# Patient Record
Sex: Female | Born: 1980 | Race: Asian | Hispanic: No | Marital: Married | Smoking: Former smoker
Health system: Southern US, Community
[De-identification: ages and names within clinical notes are randomized; demographics above are authoritative.]

## PROBLEM LIST (undated history)

## (undated) ENCOUNTER — Inpatient Hospital Stay (HOSPITAL_COMMUNITY): Payer: Self-pay

## (undated) DIAGNOSIS — Z789 Other specified health status: Secondary | ICD-10-CM

## (undated) DIAGNOSIS — Z8759 Personal history of other complications of pregnancy, childbirth and the puerperium: Secondary | ICD-10-CM

## (undated) DIAGNOSIS — O009 Unspecified ectopic pregnancy without intrauterine pregnancy: Secondary | ICD-10-CM

## (undated) DIAGNOSIS — IMO0002 Reserved for concepts with insufficient information to code with codable children: Secondary | ICD-10-CM

## (undated) HISTORY — DX: Personal history of other complications of pregnancy, childbirth and the puerperium: Z87.59

## (undated) HISTORY — DX: Unspecified ectopic pregnancy without intrauterine pregnancy: O00.90

---

## 2002-01-29 ENCOUNTER — Other Ambulatory Visit: Admission: RE | Admit: 2002-01-29 | Discharge: 2002-01-29 | Payer: Self-pay | Admitting: Family Medicine

## 2002-10-07 ENCOUNTER — Other Ambulatory Visit: Admission: RE | Admit: 2002-10-07 | Discharge: 2002-10-07 | Payer: Self-pay | Admitting: Obstetrics and Gynecology

## 2003-04-12 ENCOUNTER — Inpatient Hospital Stay (HOSPITAL_COMMUNITY): Admission: AD | Admit: 2003-04-12 | Discharge: 2003-04-14 | Payer: Self-pay | Admitting: Obstetrics and Gynecology

## 2003-04-13 ENCOUNTER — Encounter: Payer: Self-pay | Admitting: Obstetrics and Gynecology

## 2003-05-03 ENCOUNTER — Inpatient Hospital Stay (HOSPITAL_COMMUNITY): Admission: AD | Admit: 2003-05-03 | Discharge: 2003-05-05 | Payer: Self-pay | Admitting: *Deleted

## 2003-05-06 ENCOUNTER — Encounter: Admission: RE | Admit: 2003-05-06 | Discharge: 2003-06-05 | Payer: Self-pay | Admitting: *Deleted

## 2003-06-13 ENCOUNTER — Other Ambulatory Visit: Admission: RE | Admit: 2003-06-13 | Discharge: 2003-06-13 | Payer: Self-pay | Admitting: Obstetrics and Gynecology

## 2004-07-13 ENCOUNTER — Other Ambulatory Visit: Admission: RE | Admit: 2004-07-13 | Discharge: 2004-07-13 | Payer: Self-pay | Admitting: Obstetrics and Gynecology

## 2004-10-30 ENCOUNTER — Ambulatory Visit: Payer: Self-pay | Admitting: Family Medicine

## 2004-12-25 ENCOUNTER — Ambulatory Visit: Payer: Self-pay | Admitting: Family Medicine

## 2004-12-26 ENCOUNTER — Ambulatory Visit: Payer: Self-pay | Admitting: Internal Medicine

## 2005-01-15 ENCOUNTER — Ambulatory Visit: Payer: Self-pay | Admitting: Family Medicine

## 2005-04-02 ENCOUNTER — Ambulatory Visit: Payer: Self-pay | Admitting: Family Medicine

## 2005-07-09 ENCOUNTER — Ambulatory Visit: Payer: Self-pay | Admitting: Family Medicine

## 2005-08-02 ENCOUNTER — Ambulatory Visit: Payer: Self-pay | Admitting: Family Medicine

## 2005-08-02 ENCOUNTER — Other Ambulatory Visit: Admission: RE | Admit: 2005-08-02 | Discharge: 2005-08-02 | Payer: Self-pay | Admitting: Family Medicine

## 2005-09-25 ENCOUNTER — Encounter: Payer: Self-pay | Admitting: Family Medicine

## 2005-10-16 ENCOUNTER — Ambulatory Visit: Payer: Self-pay | Admitting: Family Medicine

## 2005-10-28 ENCOUNTER — Ambulatory Visit: Payer: Self-pay | Admitting: Family Medicine

## 2006-01-10 ENCOUNTER — Ambulatory Visit: Payer: Self-pay | Admitting: Family Medicine

## 2006-02-25 ENCOUNTER — Ambulatory Visit: Payer: Self-pay | Admitting: Family Medicine

## 2006-07-29 ENCOUNTER — Ambulatory Visit: Payer: Self-pay | Admitting: Family Medicine

## 2006-08-14 ENCOUNTER — Ambulatory Visit: Payer: Self-pay | Admitting: Family Medicine

## 2006-08-19 ENCOUNTER — Ambulatory Visit: Payer: Self-pay | Admitting: Family Medicine

## 2006-08-21 DIAGNOSIS — A5609 Other chlamydial infection of lower genitourinary tract: Secondary | ICD-10-CM | POA: Insufficient documentation

## 2006-08-22 ENCOUNTER — Ambulatory Visit: Payer: Self-pay | Admitting: Family Medicine

## 2006-11-14 ENCOUNTER — Ambulatory Visit: Payer: Self-pay | Admitting: Family Medicine

## 2006-11-14 ENCOUNTER — Encounter: Payer: Self-pay | Admitting: Family Medicine

## 2006-11-14 ENCOUNTER — Encounter (INDEPENDENT_AMBULATORY_CARE_PROVIDER_SITE_OTHER): Payer: Self-pay | Admitting: Specialist

## 2006-11-14 ENCOUNTER — Other Ambulatory Visit: Admission: RE | Admit: 2006-11-14 | Discharge: 2006-11-14 | Payer: Self-pay | Admitting: Family Medicine

## 2007-02-05 ENCOUNTER — Encounter: Payer: Self-pay | Admitting: Family Medicine

## 2007-02-05 DIAGNOSIS — N6019 Diffuse cystic mastopathy of unspecified breast: Secondary | ICD-10-CM | POA: Insufficient documentation

## 2007-02-21 ENCOUNTER — Emergency Department: Payer: Self-pay | Admitting: Emergency Medicine

## 2007-02-22 ENCOUNTER — Inpatient Hospital Stay (HOSPITAL_COMMUNITY): Admission: AD | Admit: 2007-02-22 | Discharge: 2007-02-22 | Payer: Self-pay | Admitting: Obstetrics and Gynecology

## 2007-10-22 DIAGNOSIS — IMO0002 Reserved for concepts with insufficient information to code with codable children: Secondary | ICD-10-CM

## 2007-10-22 DIAGNOSIS — R87619 Unspecified abnormal cytological findings in specimens from cervix uteri: Secondary | ICD-10-CM

## 2007-10-22 HISTORY — DX: Reserved for concepts with insufficient information to code with codable children: IMO0002

## 2007-10-22 HISTORY — DX: Unspecified abnormal cytological findings in specimens from cervix uteri: R87.619

## 2007-10-22 HISTORY — PX: CRYOTHERAPY: SHX1416

## 2008-04-01 ENCOUNTER — Ambulatory Visit: Payer: Self-pay | Admitting: Family Medicine

## 2008-04-01 DIAGNOSIS — N390 Urinary tract infection, site not specified: Secondary | ICD-10-CM | POA: Insufficient documentation

## 2008-04-01 LAB — CONVERTED CEMR LAB
Ketones, urine, test strip: NEGATIVE
Nitrite: NEGATIVE
Urine crystals, microscopic: 0 /hpf

## 2008-04-02 ENCOUNTER — Encounter: Payer: Self-pay | Admitting: Family Medicine

## 2008-05-03 ENCOUNTER — Ambulatory Visit: Payer: Self-pay | Admitting: Family Medicine

## 2008-05-03 DIAGNOSIS — R87619 Unspecified abnormal cytological findings in specimens from cervix uteri: Secondary | ICD-10-CM | POA: Insufficient documentation

## 2009-04-06 ENCOUNTER — Ambulatory Visit: Payer: Self-pay | Admitting: Family Medicine

## 2009-04-06 LAB — CONVERTED CEMR LAB
Blood in Urine, dipstick: NEGATIVE
Protein, U semiquant: NEGATIVE
Urobilinogen, UA: 0.2
WBC Urine, dipstick: NEGATIVE

## 2009-05-02 ENCOUNTER — Telehealth: Payer: Self-pay | Admitting: Family Medicine

## 2009-05-04 ENCOUNTER — Ambulatory Visit: Payer: Self-pay | Admitting: Family Medicine

## 2009-06-27 ENCOUNTER — Ambulatory Visit: Payer: Self-pay | Admitting: Family Medicine

## 2009-06-27 LAB — CONVERTED CEMR LAB
Bacteria, UA: 0
Glucose, Urine, Semiquant: NEGATIVE
Protein, U semiquant: NEGATIVE
WBC Urine, dipstick: NEGATIVE
pH: 6.5

## 2009-12-29 ENCOUNTER — Ambulatory Visit: Payer: Self-pay | Admitting: Family Medicine

## 2009-12-29 LAB — CONVERTED CEMR LAB
Casts: 0 /lpf
Ketones, urine, test strip: NEGATIVE
Specific Gravity, Urine: 1.01
Urine crystals, microscopic: 0 /hpf
Yeast, UA: 0

## 2010-06-14 ENCOUNTER — Ambulatory Visit: Payer: Self-pay

## 2010-11-20 NOTE — Assessment & Plan Note (Signed)
Summary: uti/ alc   Vital Signs:  Patient profile:   30 year old female Height:      59 inches Weight:      126.50 pounds BMI:     25.64 Temp:     98.4 degrees F oral Pulse rate:   80 / minute Pulse rhythm:   regular BP sitting:   100 / 68  (right arm) Cuff size:   regular  Vitals Entered By: Lewanda Rife LPN (December 29, 2009 12:30 PM)  History of Present Illness: yesteray in class urgency to urinate and then painful  burning to urinate and having to go all the time pyridium really helps the pain  no fever or vomiting  is drinking water an drinking lots of water   last uti was in sept   a lot of stress- uncle just died unexpectedly - and she spent a lot of time at 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 Sulfa 2)  ! Asa 3)  ! 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 Eyes:  Denies blurring. CV:  Denies chest pain or discomfort and palpitations. Resp:  Denies cough and shortness of breath. GI:  Denies change in bowel habits, nausea, and vomiting. GU:  Complains of dysuria; denies urinary frequency. MS:  Complains of low back pain. Derm:  Denies poor wound healing and rash.  Physical Exam  General:  Well-developed,well-nourished,in no acute distress; alert,appropriate and cooperative throughout examination Head:  normocephalic, atraumatic, and no abnormalities observed.   Mouth:  pharynx  pink and moist.   Neck:  supple with full rom and no masses or thyromegally, no JVD or carotid bruit  Lungs:  Normal respiratory effort, chest expands symmetrically. Lungs are clear to auscultation, no crackles or wheezes. Heart:  Normal rate and regular rhythm. S1 and S2 normal without gallop, murmur, click, rub or other extra sounds. Abdomen:  no suprapubic tenderness or fullness felt  Msk:  no CVA tenderness  Skin:  Intact without suspicious lesions or rashes Inguinal Nodes:  No significant adenopathy Psych:  normal affect, talkative and pleasant    Impression & Recommendations:  Problem # 1:  UTI (ICD-599.0) new uti (last in fall ) -- in light of this sent for cx tx with low dose cipro and update pt advised to update me if symptoms worsen or do not improve  adv inc water intake rev ways to avoid utis-- drink water and do not hold urine too long  The following medications were removed from the medication list:    Ciprofloxacin Hcl 500 Mg Tabs (Ciprofloxacin hcl) .Marland Kitchen... Take 1 two times a day Her updated medication list for this problem includes:    Azo-standard 95 Mg Tabs (Phenazopyridine hcl) ..... Otc as directed.    Cipro 250 Mg Tabs (Ciprofloxacin hcl) .Marland Kitchen... 1 by mouth two times a day for 5 days  Orders: T-Culture, Urine ([id number]) UA Dipstick W/ Micro (manual) (09811) Prescription  Created Electronically [phone])  Complete Medication List: 1)  Depo-provera 150 Mg/ml Im Susp (Medroxyprogesterone acetate) .... Injections every 3 months as directed 2)  Azo-standard 95 Mg Tabs (Phenazopyridine hcl) .... Otc as directed. 3)  Cipro 250 Mg Tabs (Ciprofloxacin hcl) .Marland Kitchen.. 1 by mouth two times a day for 5 days  Patient Instructions: 1)  continue drinking lots of water 2)  call or seek care is symptoms don't improve in 2-3 days or if you develop back pain, nausea, or vomiting  3)  I will update you when culture returns  Prescriptions: CIPRO 250 MG TABS (CIPROFLOXACIN HCL) 1 by  mouth two times a day for 5 days  #10 x 0   Entered and Authorized by:   Judith Part MD   Signed by:   Judith Part MD on 12/29/2009   Method used:   Electronically to        [facility]. [address] [phone]* (retail)       [address] [place], [place]  [id number]       Ph: [phone]       Fax: [phone]   RxID:   [id number]   Current Allergies (reviewed today): ! SULFA ! ASA ! Fredricka Bonine  Laboratory Results   Urine Tests  Date/Time Received: December 29, 2009 12:31 PM  Date/Time Reported: December 29, 2009 12:31 PM   Routine Urinalysis   Color: orange Appearance: Clear Glucose: negative   (Normal Range: Negative) Bilirubin: negative   (Normal Range: Negative) Ketone: negative   (Normal Range: Negative) Spec. Gravity: 1.010   (Normal Range: 1.003-1.035) Blood: trace-lysed   (Normal Range: Negative) pH: 6.5   (Normal Range: 5.0-8.0) Protein: orange   (Normal Range: Negative) Urobilinogen: orange   (Normal Range: 0-1) Nitrite: orange   (Normal Range: Negative) Leukocyte Esterace: orange   (Normal Range: Negative)  Urine Microscopic WBC/HPF: 4-5 RBC/HPF: 2-3 Bacteria/HPF: mild Mucous/HPF: few Epithelial/HPF: 1-2 Crystals/HPF: 0 Casts/LPF: 0 Yeast/HPF: 0 Other: 0    Comments: Pt took Azo standard which makes urine orange.     Laboratory Results   Urine Tests    Routine Urinalysis   Color: orange Appearance: Clear Glucose: negative   (Normal Range: Negative) Bilirubin: negative   (Normal Range: Negative) Ketone: negative   (Normal Range: Negative) Spec. Gravity: 1.010   (Normal Range: 1.003-1.035) Blood: trace-lysed   (Normal Range: Negative) pH: 6.5   (Normal Range: 5.0-8.0) Protein: orange   (Normal Range: Negative) Urobilinogen: orange   (Normal Range: 0-1) Nitrite: orange   (Normal Range: Negative) Leukocyte Esterace: orange   (Normal Range: Negative)  Urine Microscopic WBC/hpf: 4-5 RBC/hpf: 2-3 Bacteria: mild Mucous:  few Epithelial: 1-2 Crystals/LPF: 0 Casts/LPF: 0 Yeast/HPF: 0 Other: 0    Comments: Pt took Azo standard which makes urine orange.

## 2011-03-08 NOTE — Discharge Summary (Signed)
NAMEFELINA, TELLO NO.:  [id number]   MEDICAL RECORD NO.:  [id number]                   PATIENT TYPE:   LOCATION:                                       FACILITY:   PHYSICIAN:  Tracie Harrier, M.D.              DATE OF BIRTH:  [birth year]   DATE OF ADMISSION:  04/12/2003  DATE OF DISCHARGE:  04/14/2003                                 DISCHARGE SUMMARY   ADMISSION DIAGNOSES:  1. Intrauterine pregnancy at 33-1/2 weeks estimated gestational age.  2. Preterm labor.   DISCHARGE DIAGNOSES:  1. Intrauterine pregnancy at 2 weeks estimated gestational age.  2. Preterm labor on oral tocolytics.   REASON FOR ADMISSION:  Please see written H&P.   HOSPITAL COURSE:  #1.  The patient was a 30 year old primigravida who was  admitted to [facility] at 33-1/2 weeks estimated  gestational age.  Prenatal care had been uncomplicated.  The patient  presented to [facility] with complaints of low abdominal pain.  She  denied leakage of amniotic fluid or bleeding.  Cervix was 2 cm dilated, 50%  effaced with a vertex at a 0 station.  Contractions were noted on monitor.  The patient was then admitted for tocolysis.  Fetal heart tones were  reactive in the 140s-160s.  Ultrasound revealed placenta in the anterior  position without evidence of separation.  Amniotic fluid volume was within  normal limits.  The patient was given an injection of subcutaneous  terbutaline and later started on magnesium sulfate.  IV antibiotics were  administered prophylactically and betamethasone was given to enhance fetal  lung maturity.  A CBC was drawn revealing a hemoglobin of 11.2 and a  platelet count of 321,000.   #2.  On the following morning contractions continued at approximately 12 per  hour.  Magnesium sulfate was then increased and a second dose of  betamethasone was administered.  Fetal heart tones remained reactive.  On  hospital day three:   Contractions had resolved.  Fetal heart tones were  reactive.  Vital signs were stable.  Magnesium sulfate was discontinued.  The patient was placed on oral tocolytics.  The patient was later discharged  home that same day.   CONDITION ON DISCHARGE:  Stable.   DIET:  Regular as tolerated.   ACTIVITY:  Pelvic rest and no heavy lifting.  The patient was encouraged to  remain on bed rest until she returned to the office.   FOLLOWUP:  The patient is to return to the office in approximately two days.  She is to call for increasing pelvic pressure, leakage of amniotic fluid or  bleeding or increase in menstrual-like cramping.    DISCHARGE MEDICATIONS:  1. Terbutaline 2.5 mg one p.o. four hours.  2. Prenatal vitamins one p.o. daily.  3. Colace one p.o. daily p.r.n.     Julio Sicks, N.P.  Tracie Harrier, M.D.    CC/MEDQ  D:  05/27/2003  T:  05/27/2003  Job:  [id number]

## 2011-04-06 ENCOUNTER — Emergency Department: Payer: Self-pay | Admitting: Emergency Medicine

## 2011-11-28 ENCOUNTER — Inpatient Hospital Stay (HOSPITAL_COMMUNITY)
Admission: AD | Admit: 2011-11-28 | Discharge: 2011-11-28 | Disposition: A | Discharge status: Home / Self Care | Payer: BC Managed Care – PPO | Source: Ambulatory Visit | Attending: Obstetrics and Gynecology | Admitting: Obstetrics and Gynecology

## 2011-11-28 ENCOUNTER — Inpatient Hospital Stay (HOSPITAL_COMMUNITY): Payer: BC Managed Care – PPO

## 2011-11-28 ENCOUNTER — Encounter (HOSPITAL_COMMUNITY): Payer: Self-pay | Admitting: *Deleted

## 2011-11-28 DIAGNOSIS — O200 Threatened abortion: Secondary | ICD-10-CM

## 2011-11-28 DIAGNOSIS — O209 Hemorrhage in early pregnancy, unspecified: Secondary | ICD-10-CM | POA: Insufficient documentation

## 2011-11-28 HISTORY — DX: Other specified health status: Z78.9

## 2011-11-28 LAB — CBC
MCH: 24.9 pg — ABNORMAL LOW (ref 26.0–34.0)
MCHC: 33.8 g/dL (ref 30.0–36.0)
Platelets: 287 10*3/uL (ref 150–400)
RDW: 13.9 % (ref 11.5–15.5)

## 2011-11-28 LAB — POCT PREGNANCY, URINE: Preg Test, Ur: POSITIVE — AB

## 2011-11-28 LAB — URINALYSIS, ROUTINE W REFLEX MICROSCOPIC
Ketones, ur: NEGATIVE mg/dL
Leukocytes, UA: NEGATIVE
Nitrite: NEGATIVE
Protein, ur: NEGATIVE mg/dL
pH: 6.5 (ref 5.0–8.0)

## 2011-11-28 LAB — WET PREP, GENITAL
Clue Cells Wet Prep HPF POC: NONE SEEN
Yeast Wet Prep HPF POC: NONE SEEN

## 2011-11-28 NOTE — Progress Notes (Addendum)
Unsure of LMP, has been on Depo-Provera until 8/12, intentionally stopped, no period since.  Spotting brown  x1 wk, now bright red bleeding.  Had blood work done in office on Tues this week.

## 2011-11-28 NOTE — ED Provider Notes (Signed)
History     CSN: [id number]  Arrival date & time 11/28/11  1408   None     No chief complaint on file.   HPI Natalie Kelley is a 31 y.o. female @ 9 weeks and 4 days gestation who presents to MAU for vaginal bleeding. Ultrasound done in office 1/21 and showed an early IUP with cardiac activity. (Per Dr. Vincente Poli). Patient states she has had spotting for the past week and then today bleeding increased and was bright read. The history was provided by the patient and Dr. Vincente Poli.   Past Medical History  Diagnosis Date  . No pertinent past medical history     Past Surgical History  Procedure Date  . No past surgeries     History reviewed. No pertinent family history.  History  Substance Use Topics  . Smoking status: Never Smoker   . Smokeless tobacco: Not on file  . Alcohol Use: No    OB History    Grav Para Term Preterm Abortions TAB SAB Ect Mult Living   3 1  1 1  1   1       Review of Systems  Constitutional: Negative for fever, chills, diaphoresis and fatigue.  HENT: Negative for ear pain, congestion, sore throat, facial swelling, neck pain, neck stiffness, dental problem and sinus pressure.   Eyes: Negative for photophobia, pain and discharge.  Respiratory: Negative for cough, chest tightness and wheezing.   Gastrointestinal: Negative for nausea, vomiting, abdominal pain, diarrhea, constipation and abdominal distention.  Genitourinary: Positive for frequency and vaginal bleeding. Negative for dysuria, urgency, flank pain, difficulty urinating and vaginal pain.  Musculoskeletal: Negative for myalgias, back pain and gait problem.  Skin: Negative for color change and rash.  Neurological: Negative for dizziness, speech difficulty, weakness, light-headedness, numbness and headaches.  Psychiatric/Behavioral: Negative for confusion and agitation.    Allergies  Aspirin; Sulfonamide derivatives; and Terbutaline  Home Medications  No current outpatient prescriptions on  file.  BP 107/57  Pulse 85  Temp(Src) 98.8 F (37.1 C) (Oral)  Resp 18  Ht 4\' 11"  (1.499 m)  Wt 132 lb (59.875 kg)  BMI 26.66 kg/m2  Breastfeeding? Unknown  Physical Exam  Nursing note and vitals reviewed. Constitutional: She is oriented to person, place, and time. She appears well-developed and well-nourished.  HENT:  Head: Normocephalic.  Eyes: EOM are normal.  Neck: Neck supple.  Cardiovascular: Normal rate.   Pulmonary/Chest: Effort normal.  Abdominal: Soft. There is no tenderness.  Genitourinary:       External genitalia without lesions. Small amount of blood vaginal vault. Cervix closed, long, no CMT, no adnexal tenderness. Uterus approximately 8 to 10 week size.   Musculoskeletal: Normal range of motion.  Neurological: She is alert and oriented to person, place, and time. No cranial nerve deficit.  Skin: Skin is warm and dry.  Psychiatric: She has a normal mood and affect. Her behavior is normal. Judgment and thought content normal.   Results for orders placed during the hospital encounter of 11/28/11 (from the past 24 hour(s))  URINALYSIS, ROUTINE W REFLEX MICROSCOPIC     Status: Abnormal   Collection Time   11/28/11  2:30 PM      Component Value Range   Color, Urine YELLOW  YELLOW    APPearance HAZY (*) CLEAR    Specific Gravity, Urine 1.015  1.005 - 1.030    pH 6.5  5.0 - 8.0    Glucose, UA NEGATIVE  NEGATIVE (mg/dL)   Hgb urine  dipstick LARGE (*) NEGATIVE    Bilirubin Urine NEGATIVE  NEGATIVE    Ketones, ur NEGATIVE  NEGATIVE (mg/dL)   Protein, ur NEGATIVE  NEGATIVE (mg/dL)   Urobilinogen, UA 0.2  0.0 - 1.0 (mg/dL)   Nitrite NEGATIVE  NEGATIVE    Leukocytes, UA NEGATIVE  NEGATIVE   URINE MICROSCOPIC-ADD ON     Status: Abnormal   Collection Time   11/28/11  2:30 PM      Component Value Range   Squamous Epithelial / LPF FEW (*) RARE    WBC, UA 0-2  <3 (WBC/hpf)   RBC / HPF TOO NUMEROUS TO COUNT  <3 (RBC/hpf)   Bacteria, UA RARE  RARE   POCT PREGNANCY, URINE      Status: Abnormal   Collection Time   11/28/11  2:41 PM      Component Value Range   Preg Test, Ur POSITIVE (*) NEGATIVE   ABO/RH     Status: Normal   Collection Time   11/28/11  3:15 PM      Component Value Range   ABO/RH(D) O POS    CBC     Status: Abnormal   Collection Time   11/28/11  3:15 PM      Component Value Range   WBC 9.1  4.0 - 10.5 (K/uL)   RBC 4.93  3.87 - 5.11 (MIL/uL)   Hemoglobin 12.3  12.0 - 15.0 (g/dL)   HCT 46.9  62.9 - 52.8 (%)   MCV 73.8 (*) 78.0 - 100.0 (fL)   MCH 24.9 (*) 26.0 - 34.0 (pg)   MCHC 33.8  30.0 - 36.0 (g/dL)   RDW 41.3  24.4 - 01.0 (%)   Platelets 287  150 - 400 (K/uL)  WET PREP, GENITAL     Status: Abnormal   Collection Time   11/28/11  4:10 PM      Component Value Range   Yeast Wet Prep HPF POC NONE SEEN  NONE SEEN    Trich, Wet Prep NONE SEEN  NONE SEEN    Clue Cells Wet Prep HPF POC NONE SEEN  NONE SEEN    WBC, Wet Prep HPF POC FEW (*) NONE SEEN     ED Course: Discussed with Dr. Vincente Poli. Will order labs and ultrasound.   Procedures   MDM: verbal report of ultrasound viable IUP with small SCH.    Assessment:  Bleeding in first trimester pregnancy   [facility]  Plan:  Instructions on threatened AB   Follow up in the office.   Return here as needed.       [place], [place] 11/28/11 [phone]

## 2011-11-29 LAB — GC/CHLAMYDIA PROBE AMP, GENITAL: Chlamydia, DNA Probe: NEGATIVE

## 2012-05-06 ENCOUNTER — Inpatient Hospital Stay (HOSPITAL_COMMUNITY)
Admission: AD | Admit: 2012-05-06 | Discharge: 2012-05-10 | DRG: 379 | Disposition: A | Discharge status: Home / Self Care | Payer: BC Managed Care – PPO | Source: Ambulatory Visit | Attending: Obstetrics and Gynecology | Admitting: Obstetrics and Gynecology

## 2012-05-06 ENCOUNTER — Encounter (HOSPITAL_COMMUNITY): Payer: Self-pay | Admitting: *Deleted

## 2012-05-06 ENCOUNTER — Observation Stay (HOSPITAL_COMMUNITY): Payer: BC Managed Care – PPO

## 2012-05-06 DIAGNOSIS — O368390 Maternal care for abnormalities of the fetal heart rate or rhythm, unspecified trimester, not applicable or unspecified: Secondary | ICD-10-CM | POA: Diagnosis not present

## 2012-05-06 DIAGNOSIS — O4700 False labor before 37 completed weeks of gestation, unspecified trimester: Principal | ICD-10-CM | POA: Diagnosis present

## 2012-05-06 HISTORY — DX: Reserved for concepts with insufficient information to code with codable children: IMO0002

## 2012-05-06 LAB — CBC
HCT: 30.3 % — ABNORMAL LOW (ref 36.0–46.0)
Hemoglobin: 10.2 g/dL — ABNORMAL LOW (ref 12.0–15.0)
MCH: 25.1 pg — ABNORMAL LOW (ref 26.0–34.0)
MCHC: 33.7 g/dL (ref 30.0–36.0)

## 2012-05-06 LAB — COMPREHENSIVE METABOLIC PANEL
Alkaline Phosphatase: 145 U/L — ABNORMAL HIGH (ref 39–117)
BUN: 6 mg/dL (ref 6–23)
CO2: 24 mEq/L (ref 19–32)
Chloride: 104 mEq/L (ref 96–112)
GFR calc Af Amer: >90 mL/min (ref >90)
GFR calc non Af Amer: >90 mL/min (ref >90)
Glucose, Bld: 124 mg/dL — ABNORMAL HIGH (ref 70–99)
Potassium: 3.4 mEq/L — ABNORMAL LOW (ref 3.5–5.1)
Total Bilirubin: 0.3 mg/dL (ref 0.3–1.2)

## 2012-05-06 MED ORDER — MAGNESIUM SULFATE BOLUS VIA INFUSION
4.0000 g | Freq: Once | INTRAVENOUS | Status: DC
  Filled 2012-05-06: qty 500

## 2012-05-06 MED ORDER — BETAMETHASONE SOD PHOS & ACET 6 (3-3) MG/ML IJ SUSP
12.0000 mg | INTRAMUSCULAR | Status: AC
  Administered 2012-05-06 – 2012-05-07 (×2): 12 mg via INTRAMUSCULAR
  Filled 2012-05-06 (×2): qty 2

## 2012-05-06 MED ORDER — ACETAMINOPHEN 325 MG PO TABS: 650.0000 mg | ORAL_TABLET | ORAL | Status: DC | PRN

## 2012-05-06 MED ORDER — ZOLPIDEM TARTRATE 5 MG PO TABS: 5.0000 mg | ORAL_TABLET | Freq: Every evening | ORAL | Status: DC | PRN

## 2012-05-06 MED ORDER — CALCIUM CARBONATE ANTACID 500 MG PO CHEW: 2.0000 | CHEWABLE_TABLET | ORAL | Status: DC | PRN

## 2012-05-06 MED ORDER — MAGNESIUM SULFATE 40 G IN LACTATED RINGERS - SIMPLE
2.5000 g/h | INTRAVENOUS | Status: AC
  Administered 2012-05-06: 2 g/h via INTRAVENOUS
  Administered 2012-05-06: 4 g/h via INTRAVENOUS
  Administered 2012-05-07: 2 g/h via INTRAVENOUS
  Administered 2012-05-08: 2.5 g/h via INTRAVENOUS
  Filled 2012-05-06 (×3): qty 500

## 2012-05-06 MED ORDER — PRENATAL MULTIVITAMIN CH
1.0000 | ORAL_TABLET | Freq: Every day | ORAL | Status: DC
  Administered 2012-05-06 – 2012-05-10 (×5): 1 via ORAL
  Filled 2012-05-06 (×5): qty 1

## 2012-05-06 MED ORDER — DOCUSATE SODIUM 100 MG PO CAPS
100.0000 mg | ORAL_CAPSULE | Freq: Every day | ORAL | Status: DC
  Filled 2012-05-06 (×3): qty 1

## 2012-05-06 MED ORDER — LACTATED RINGERS IV SOLN
INTRAVENOUS | Status: DC
  Administered 2012-05-06: 100 mL/h via INTRAVENOUS
  Administered 2012-05-06 – 2012-05-10 (×9): via INTRAVENOUS

## 2012-05-06 NOTE — H&P (Signed)
Natalie Kelley is a 31 y.o. female presenting for preterm labor.  Patient examined at [employer] where she works at 2 am today.  Was told cx=1cm and FFN negative. Was told to FU in office today.  Continues to have UCs last pm and today-moderate.  No ROM/bleeding/epigastric pain or CNS change.  Hx of 36 6/7week delivery. In office by my exam cx=2/80/-2/vtx and moderate UC palpated. Maternal Medical History:  Reason for admission: Reason for admission: contractions.  Contractions: Onset was 6-12 hours ago.    Fetal activity: Perceived fetal activity is normal.      OB History    Grav Para Term Preterm Abortions TAB SAB Ect Mult Living   3 1  1 1  1   1      Past Medical History  Diagnosis Date  . No pertinent past medical history    Past Surgical History  Procedure Date  . No past surgeries    Family History: family history is not on file. Social History:  reports that she has never smoked. She does not have any smokeless tobacco history on file. She reports that she does not drink alcohol or use illicit drugs.   Prenatal Transfer Tool  Maternal Diabetes: No Genetic Screening: Normal Maternal Ultrasounds/Referrals: Normal Fetal Ultrasounds or other Referrals:  None Maternal Substance Abuse:  No Significant Maternal Medications:  None Significant Maternal Lab Results:  None Other Comments:  None  Review of Systems  Eyes: Negative for blurred vision.  Gastrointestinal: Negative for abdominal pain.  Neurological: Negative for headaches.      Blood pressure 114/62, pulse 104, unknown if currently breastfeeding. Maternal Exam:  Uterine Assessment: Contraction strength is moderate.  Contraction frequency is irregular.   Abdomen: Fetal presentation: vertex     Fetal Exam Fetal Monitor Review: Pattern: accelerations present.       Physical Exam  Cardiovascular: Normal rate and regular rhythm.   Respiratory: Effort normal and breath sounds normal.  GI: There is no  tenderness.  Neurological: She has normal reflexes.    Prenatal labs: ABO, Rh: --/--/O POS (02/07 1515) Antibody:   Rubella:   RPR:    HBsAg:    HIV:    GBS:     Assessment/Plan: [name] at 48 3/7 weeks with PTL.  D/W patient and husband above-will begin Magnesium Sulfate, betamethasone, U/S, will check labs. Risks benefits , side effects reviewed.   Natalie Kelley,Natalie Kelley 05/06/2012, 2:12 PM

## 2012-05-07 MED ORDER — SALINE SPRAY 0.65 % NA SOLN
1.0000 | NASAL | Status: DC | PRN
  Administered 2012-05-07: 1 via NASAL
  Filled 2012-05-07: qty 44

## 2012-05-07 NOTE — Progress Notes (Signed)
Pt reports decrease in contractions overnight.  No VB or lof.  + FM  AF, VSS FHT reassuring Toco occasional, rare Cvx deferred  A/P:  Continue bedrest BMZ #2 today  Will d/c mag in morning and consider home on bedrest if stable and cvx unchanged (2cm yesterday)

## 2012-05-08 ENCOUNTER — Inpatient Hospital Stay (HOSPITAL_COMMUNITY): Payer: BC Managed Care – PPO

## 2012-05-08 MED ORDER — MAGNESIUM SULFATE 40 G IN LACTATED RINGERS - SIMPLE
2.0000 g/h | INTRAVENOUS | Status: AC
  Administered 2012-05-08 – 2012-05-09 (×2): 2 g/h via INTRAVENOUS
  Filled 2012-05-08 (×2): qty 500

## 2012-05-08 NOTE — Progress Notes (Signed)
UR Chart review completed.  

## 2012-05-08 NOTE — Progress Notes (Signed)
Patient ID: Natalie Kelley, female   DOB: 1981-01-14, 31 y.o.   MRN: [id number] Pt reports ctxs persist and still mild to mod VSSAF FH 140s Ctx 4-5 x hour  PTL Completing BMZ Increase Mag until quiescent DL

## 2012-05-09 MED ORDER — NIFEDIPINE 10 MG PO CAPS
10.0000 mg | ORAL_CAPSULE | Freq: Four times a day (QID) | ORAL | Status: DC
  Administered 2012-05-09 – 2012-05-10 (×4): 10 mg via ORAL
  Filled 2012-05-09 (×4): qty 1

## 2012-05-09 MED ORDER — NIFEDIPINE 10 MG PO CAPS
20.0000 mg | ORAL_CAPSULE | Freq: Once | ORAL | Status: AC
  Administered 2012-05-09: 20 mg via ORAL
  Filled 2012-05-09: qty 2

## 2012-05-09 NOTE — Progress Notes (Signed)
Patient ID: Natalie Kelley, female   DOB: 09/25/1981, 31 y.o.   MRN: [id number] Pt without complaints VSSAF FHR 140s with accels Ctxs 1-4 x hour mild DTRs 1/4  PTL SP BMZ Wean mag today to procardia DL

## 2012-05-09 NOTE — Progress Notes (Signed)
1219- magnesium sulfate dc'd.

## 2012-05-09 NOTE — Progress Notes (Signed)
Magnesium sulfate decreased to 1gm/hr a s ordered by dr. Rachell Cipro.

## 2012-05-10 LAB — CULTURE, BETA STREP (GROUP B ONLY)

## 2012-05-10 MED ORDER — NIFEDIPINE 10 MG PO CAPS: 10.0000 mg | ORAL_CAPSULE | Freq: Four times a day (QID) | ORAL | Status: DC

## 2012-05-10 NOTE — Progress Notes (Signed)
Patient ID: Natalie Kelley, female   DOB: January 01, 1981, 31 y.o.   MRN: [id number] Pt without complaints GFM occas Ctxs VSSAF FHR 140s with accel Ctxs 1-3 x hour  Cx  1.5/80/-3 posterior  PTL at 33 weeks SP MgSo4 SP BMZ x 2 DC home on procardia and BR with fu in 1 week DL

## 2012-05-13 NOTE — Discharge Summary (Signed)
Obstetric Discharge Summary Reason for Admission: observation/evaluation Prenatal Procedures: NST and ultrasound Intrapartum Procedures: Mag SO4 and Procardia Postpartum Procedures: none Complications-Operative and Postpartum: NA Hemoglobin  Date Value Range Status  05/06/2012 10.2* 12.0 - 15.0 Kelley/dL Final     HCT  Date Value Range Status  05/06/2012 30.3* 36.0 - 46.0 % Final    Physical Exam:  General: alert and cooperative Lochia: na Uterine Fundus: gravid uterus Incision: na DVT Evaluation: No evidence of DVT seen on physical exam.  Discharge Diagnoses: Premature labor  Discharge Information: Date: 05/13/2012 Activity: pelvic rest Diet: routine Medications: PNV and procardia Condition: stable Instructions: call for increase in contractions, LOF VB ,decreased FM Discharge to: home   Newborn Data: This patient has no babies on file. Home with na.  Natalie Kelley 05/13/2012, 8:44 AM

## 2012-06-02 ENCOUNTER — Encounter (HOSPITAL_COMMUNITY): Payer: Self-pay | Admitting: *Deleted

## 2012-06-02 ENCOUNTER — Inpatient Hospital Stay (HOSPITAL_COMMUNITY)
Admission: AD | Admit: 2012-06-02 | Discharge: 2012-06-03 | Disposition: A | Discharge status: Home / Self Care | Payer: BC Managed Care – PPO | Source: Ambulatory Visit | Attending: Obstetrics & Gynecology | Admitting: Obstetrics & Gynecology

## 2012-06-02 DIAGNOSIS — O4700 False labor before 37 completed weeks of gestation, unspecified trimester: Secondary | ICD-10-CM | POA: Insufficient documentation

## 2012-06-02 NOTE — MAU Note (Signed)
Contractions consistent since 2100, some "trickling of fluid".

## 2012-06-02 NOTE — MAU Note (Signed)
PT SAYS SHE WENT TO OFFIC EON Monday- VE 1-2 CM. AFTER SHE LEFT OFFICE SHE NOTICED SOME VAG LEAKING- UNSURE IF  SROM. DENIES HSV AND MRSA.

## 2012-06-11 ENCOUNTER — Inpatient Hospital Stay (HOSPITAL_COMMUNITY): Payer: BC Managed Care – PPO | Admitting: Anesthesiology

## 2012-06-11 ENCOUNTER — Encounter (HOSPITAL_COMMUNITY): Payer: Self-pay | Admitting: Anesthesiology

## 2012-06-11 ENCOUNTER — Encounter (HOSPITAL_COMMUNITY): Payer: Self-pay | Admitting: *Deleted

## 2012-06-11 ENCOUNTER — Inpatient Hospital Stay (HOSPITAL_COMMUNITY)
Admission: AD | Admit: 2012-06-11 | Discharge: 2012-06-13 | DRG: 373 | Disposition: A | Discharge status: Home / Self Care | Payer: BC Managed Care – PPO | Source: Ambulatory Visit | Attending: Obstetrics and Gynecology | Admitting: Obstetrics and Gynecology

## 2012-06-11 LAB — OB RESULTS CONSOLE RPR: RPR: NONREACTIVE

## 2012-06-11 LAB — OB RESULTS CONSOLE GBS: GBS: NEGATIVE

## 2012-06-11 LAB — OB RESULTS CONSOLE RUBELLA ANTIBODY, IGM: Rubella: IMMUNE

## 2012-06-11 LAB — CBC
HCT: 34.1 % — ABNORMAL LOW (ref 36.0–46.0)
Hemoglobin: 11.4 g/dL — ABNORMAL LOW (ref 12.0–15.0)
MCV: 74 fL — ABNORMAL LOW (ref 78.0–100.0)
RDW: 13.7 % (ref 11.5–15.5)
WBC: 10.7 10*3/uL — ABNORMAL HIGH (ref 4.0–10.5)

## 2012-06-11 LAB — TYPE AND SCREEN

## 2012-06-11 MED ORDER — ACETAMINOPHEN 325 MG PO TABS: 650.0000 mg | ORAL_TABLET | ORAL | Status: DC | PRN

## 2012-06-11 MED ORDER — DIPHENHYDRAMINE HCL 50 MG/ML IJ SOLN: 12.5000 mg | INTRAMUSCULAR | Status: DC | PRN

## 2012-06-11 MED ORDER — PHENYLEPHRINE 40 MCG/ML (10ML) SYRINGE FOR IV PUSH (FOR BLOOD PRESSURE SUPPORT): 80.0000 ug | PREFILLED_SYRINGE | INTRAVENOUS | Status: DC | PRN

## 2012-06-11 MED ORDER — EPHEDRINE 5 MG/ML INJ
10.0000 mg | INTRAVENOUS | Status: DC | PRN
  Filled 2012-06-11: qty 4

## 2012-06-11 MED ORDER — LIDOCAINE HCL (PF) 1 % IJ SOLN
INTRAMUSCULAR | Status: DC | PRN
  Administered 2012-06-11: 8 mL
  Administered 2012-06-11: 9 mL

## 2012-06-11 MED ORDER — LACTATED RINGERS IV SOLN
INTRAVENOUS | Status: DC
  Administered 2012-06-11: 23:00:00 via INTRAVENOUS

## 2012-06-11 MED ORDER — FENTANYL 2.5 MCG/ML BUPIVACAINE 1/10 % EPIDURAL INFUSION (WH - ANES)
INTRAMUSCULAR | Status: DC | PRN
  Administered 2012-06-11: 14 mL/h via EPIDURAL

## 2012-06-11 MED ORDER — LIDOCAINE HCL (PF) 1 % IJ SOLN
30.0000 mL | INTRAMUSCULAR | Status: DC | PRN
  Administered 2012-06-12: 30 mL via SUBCUTANEOUS
  Filled 2012-06-11: qty 30

## 2012-06-11 MED ORDER — ONDANSETRON HCL 4 MG/2ML IJ SOLN: 4.0000 mg | Freq: Four times a day (QID) | INTRAMUSCULAR | Status: DC | PRN

## 2012-06-11 MED ORDER — OXYTOCIN 40 UNITS IN LACTATED RINGERS INFUSION - SIMPLE MED
62.5000 mL/h | Freq: Once | INTRAVENOUS | Status: AC
  Administered 2012-06-12: 62.5 mL/h via INTRAVENOUS

## 2012-06-11 MED ORDER — FLEET ENEMA 7-19 GM/118ML RE ENEM: 1.0000 | ENEMA | RECTAL | Status: DC | PRN

## 2012-06-11 MED ORDER — OXYTOCIN BOLUS FROM INFUSION
250.0000 mL | Freq: Once | INTRAVENOUS | Status: AC
  Administered 2012-06-12: 250 mL via INTRAVENOUS
  Filled 2012-06-11: qty 500

## 2012-06-11 MED ORDER — PHENYLEPHRINE 40 MCG/ML (10ML) SYRINGE FOR IV PUSH (FOR BLOOD PRESSURE SUPPORT)
80.0000 ug | PREFILLED_SYRINGE | INTRAVENOUS | Status: DC | PRN
  Filled 2012-06-11: qty 5

## 2012-06-11 MED ORDER — LACTATED RINGERS IV SOLN
500.0000 mL | Freq: Once | INTRAVENOUS | Status: AC
  Administered 2012-06-11: 500 mL via INTRAVENOUS

## 2012-06-11 MED ORDER — OXYTOCIN 40 UNITS IN LACTATED RINGERS INFUSION - SIMPLE MED
1.0000 m[IU]/min | INTRAVENOUS | Status: DC
  Administered 2012-06-11: 2 m[IU]/min via INTRAVENOUS
  Filled 2012-06-11: qty 1000

## 2012-06-11 MED ORDER — CITRIC ACID-SODIUM CITRATE 334-500 MG/5ML PO SOLN: 30.0000 mL | ORAL | Status: DC | PRN

## 2012-06-11 MED ORDER — EPHEDRINE 5 MG/ML INJ
10.0000 mg | INTRAVENOUS | Status: DC | PRN
Start: 2012-06-11 — End: 2012-06-12

## 2012-06-11 MED ORDER — OXYCODONE-ACETAMINOPHEN 5-325 MG PO TABS: 1.0000 | ORAL_TABLET | ORAL | Status: DC | PRN

## 2012-06-11 MED ORDER — LACTATED RINGERS IV SOLN: 500.0000 mL | INTRAVENOUS | Status: DC | PRN

## 2012-06-11 MED ORDER — FENTANYL 2.5 MCG/ML BUPIVACAINE 1/10 % EPIDURAL INFUSION (WH - ANES)
14.0000 mL/h | INTRAMUSCULAR | Status: DC
  Filled 2012-06-11: qty 60

## 2012-06-11 NOTE — Progress Notes (Signed)
SROM clear FHT reactive UCs irreg  D/W patient above, if not in good labor in about 1-2 hours will begin pitocin augmentation. Epidural prn

## 2012-06-11 NOTE — Anesthesia Procedure Notes (Signed)
Epidural Patient location during procedure: OB Start time: 06/11/2012 10:13 PM End time: 06/11/2012 10:16 PM Reason for block: procedure for pain  Staffing Anesthesiologist: Sandrea Hughs  Preanesthetic Checklist Completed: patient identified, site marked, surgical consent, pre-op evaluation, timeout performed, IV checked, risks and benefits discussed and monitors and equipment checked  Epidural Patient position: sitting Prep: site prepped and draped and DuraPrep Patient monitoring: continuous pulse ox and blood pressure Approach: midline Injection technique: LOR air  Needle:  Needle type: Tuohy  Needle gauge: 17 G Needle length: 9 cm Needle insertion depth: 4 cm Catheter type: closed end flexible Catheter size: 19 Gauge Catheter at skin depth: 9 cm Test dose: negative and Other  Assessment Sensory level: T8 Events: blood not aspirated, injection not painful, no injection resistance, negative IV test and no paresthesia

## 2012-06-11 NOTE — H&P (Signed)
Natalie Kelley is a 31 y.o. female presenting for C/O spotting. No gush of fluid. Notice DRB with wiping. No HA/vision change or epigastric pain. Maternal Medical History:  Reason for admission: Reason for admission: vaginal bleeding.  Contractions: Onset was 3-5 hours ago.    Fetal activity: Perceived fetal activity is normal.      OB History    Grav Para Term Preterm Abortions TAB SAB Ect Mult Living   3 1  1 1   0 1  1     Past Medical History  Diagnosis Date  . No pertinent past medical history   . Abnormal pap 2009   Past Surgical History  Procedure Date  . No past surgeries   . Cryotherapy 2009   Family History: family history is not on file. Social History:  reports that she has never smoked. She has never used smokeless tobacco. She reports that she does not drink alcohol or use illicit drugs.   Prenatal Transfer Tool  Maternal Diabetes: No Genetic Screening: Normal Maternal Ultrasounds/Referrals: Normal Fetal Ultrasounds or other Referrals:  None Maternal Substance Abuse:  No Significant Maternal Medications:  None Significant Maternal Lab Results:  None Other Comments:  None  Review of Systems  Eyes: Negative for blurred vision.  Gastrointestinal: Negative for abdominal pain.  Neurological: Negative for headaches.    Dilation: 4 Effacement (%): 100 Station: -1;0 Exam by:: Dr. Henderson Cloud Blood pressure 118/73, pulse 107, temperature 98.8 F (37.1 C), temperature source Oral, resp. rate 16, height 4\' 11"  (1.499 m), weight 62.778 kg (138 lb 6.4 oz). Maternal Exam:  Uterine Assessment: Contraction strength is moderate.  Contraction frequency is irregular.   Abdomen: Fetal presentation: vertex     Fetal Exam Fetal Monitor Review: Pattern: accelerations present.       Physical Exam  Cardiovascular: Normal rate and regular rhythm.   Respiratory: Effort normal and breath sounds normal.  GI: There is no tenderness.  Genitourinary:       Cx 3-4/90/0 to -1    Neurological: She has normal reflexes.   FHT reactive UCs q3-5 min moderate to palpation Prenatal labs: ABO, Rh: --/--/O POS (07/17 1415) Antibody: NEG (07/17 1415) Rubella: Immune (08/22 1749) RPR: Nonreactive (08/22 1749)  HBsAg: Negative (08/22 1749)  HIV:    GBS: Negative (08/22 1749)   Assessment/Plan: [name] at 37 4/7 weeks with prodromal labor. Will walk and recheck cervix [facility]   Gabriela Giannelli II,[facility] E 06/11/2012, 6:05 PM

## 2012-06-11 NOTE — Anesthesia Preprocedure Evaluation (Signed)

## 2012-06-12 ENCOUNTER — Encounter (HOSPITAL_COMMUNITY): Payer: Self-pay | Admitting: Obstetrics

## 2012-06-12 LAB — CBC
Hemoglobin: 10.8 g/dL — ABNORMAL LOW (ref 12.0–15.0)
MCHC: 33.2 g/dL (ref 30.0–36.0)
RBC: 4.39 MIL/uL (ref 3.87–5.11)
WBC: 14.8 10*3/uL — ABNORMAL HIGH (ref 4.0–10.5)

## 2012-06-12 MED ORDER — DIPHENHYDRAMINE HCL 25 MG PO CAPS: 25.0000 mg | ORAL_CAPSULE | Freq: Four times a day (QID) | ORAL | Status: DC | PRN

## 2012-06-12 MED ORDER — ONDANSETRON HCL 4 MG PO TABS: 4.0000 mg | ORAL_TABLET | ORAL | Status: DC | PRN

## 2012-06-12 MED ORDER — OXYCODONE-ACETAMINOPHEN 5-325 MG PO TABS: 1.0000 | ORAL_TABLET | ORAL | Status: DC | PRN

## 2012-06-12 MED ORDER — BENZOCAINE-MENTHOL 20-0.5 % EX AERO
1.0000 "application " | INHALATION_SPRAY | CUTANEOUS | Status: DC | PRN
  Administered 2012-06-12: 1 via TOPICAL
  Filled 2012-06-12: qty 56

## 2012-06-12 MED ORDER — IBUPROFEN 600 MG PO TABS: 600.0000 mg | ORAL_TABLET | Freq: Four times a day (QID) | ORAL | Status: DC

## 2012-06-12 MED ORDER — METHYLERGONOVINE MALEATE 0.2 MG/ML IJ SOLN
INTRAMUSCULAR | Status: AC
  Administered 2012-06-12: 0.2 mg via INTRAMUSCULAR
  Filled 2012-06-12: qty 1

## 2012-06-12 MED ORDER — SENNOSIDES-DOCUSATE SODIUM 8.6-50 MG PO TABS
2.0000 | ORAL_TABLET | Freq: Every day | ORAL | Status: DC
  Administered 2012-06-12: 2 via ORAL

## 2012-06-12 MED ORDER — FLEET ENEMA 7-19 GM/118ML RE ENEM: 1.0000 | ENEMA | Freq: Every day | RECTAL | Status: DC | PRN

## 2012-06-12 MED ORDER — OXYTOCIN 40 UNITS IN LACTATED RINGERS INFUSION - SIMPLE MED: 62.5000 mL/h | INTRAVENOUS | Status: AC

## 2012-06-12 MED ORDER — METHYLERGONOVINE MALEATE 0.2 MG/ML IJ SOLN
0.2000 mg | Freq: Once | INTRAMUSCULAR | Status: AC
  Administered 2012-06-12: 0.2 mg via INTRAMUSCULAR

## 2012-06-12 MED ORDER — LANOLIN HYDROUS EX OINT: TOPICAL_OINTMENT | CUTANEOUS | Status: DC | PRN

## 2012-06-12 MED ORDER — DIBUCAINE 1 % RE OINT: 1.0000 "application " | TOPICAL_OINTMENT | RECTAL | Status: DC | PRN

## 2012-06-12 MED ORDER — PRENATAL MULTIVITAMIN CH
1.0000 | ORAL_TABLET | Freq: Every day | ORAL | Status: DC
  Administered 2012-06-12 – 2012-06-13 (×2): 1 via ORAL
  Filled 2012-06-12 (×2): qty 1

## 2012-06-12 MED ORDER — ONDANSETRON HCL 4 MG/2ML IJ SOLN: 4.0000 mg | INTRAMUSCULAR | Status: DC | PRN

## 2012-06-12 MED ORDER — ZOLPIDEM TARTRATE 5 MG PO TABS: 5.0000 mg | ORAL_TABLET | Freq: Every evening | ORAL | Status: DC | PRN

## 2012-06-12 MED ORDER — SIMETHICONE 80 MG PO CHEW: 80.0000 mg | CHEWABLE_TABLET | ORAL | Status: DC | PRN

## 2012-06-12 MED ORDER — BISACODYL 10 MG RE SUPP: 10.0000 mg | Freq: Every day | RECTAL | Status: DC | PRN

## 2012-06-12 MED ORDER — TETANUS-DIPHTH-ACELL PERTUSSIS 5-2.5-18.5 LF-MCG/0.5 IM SUSP: 0.5000 mL | Freq: Once | INTRAMUSCULAR | Status: DC

## 2012-06-12 MED ORDER — OXYTOCIN 40 UNITS IN LACTATED RINGERS INFUSION - SIMPLE MED: 62.5000 mL/h | INTRAVENOUS | Status: DC

## 2012-06-12 MED ORDER — WITCH HAZEL-GLYCERIN EX PADS: 1.0000 "application " | MEDICATED_PAD | CUTANEOUS | Status: DC | PRN

## 2012-06-12 NOTE — Progress Notes (Signed)
Delivery Note Good progress in second stage SVD VMI Apgars 9/9  , weight pending Placenta 3 vessels, intact EBL 350 cc Cx/vagina intact Small second degree MLE due to tight introitus, repaired Patient / infant stable in LDR

## 2012-06-12 NOTE — Progress Notes (Signed)
Post Partum Day 0 Subjective: no complaints, up ad lib, voiding and tolerating PO  Objective: Blood pressure 123/78, pulse 81, temperature 98.4 F (36.9 C), temperature source Oral, resp. rate 20, height 4\' 11"  (1.499 m), weight 62.778 kg (138 lb 6.4 oz), SpO2 97.00%, unknown if currently breastfeeding.  Physical Exam:  General: alert and cooperative Lochia: appropriate Uterine Fundus: firm Incision: perineum intact, small labial edema DVT Evaluation: No evidence of DVT seen on physical exam.   Basename 06/12/12 0515 06/11/12 2017  HGB 10.8* 11.4*  HCT 32.5* 34.1*    Assessment/Plan: Plan for discharge tomorrow   LOS: 1 day   CURTIS,CAROL G 06/12/2012, 7:55 AM

## 2012-06-13 MED ORDER — OXYCODONE-ACETAMINOPHEN 5-325 MG PO TABS: 1.0000 | ORAL_TABLET | ORAL | Status: AC | PRN

## 2012-06-13 NOTE — Discharge Summary (Signed)
Obstetric Discharge Summary Reason for Admission: onset of labor Prenatal Procedures: none Intrapartum Procedures: spontaneous vaginal delivery Postpartum Procedures: none Complications-Operative and Postpartum: 2nd degree perineal laceration Hemoglobin  Date Value Range Status  06/12/2012 10.8* 12.0 - 15.0 g/dL Final     HCT  Date Value Range Status  06/12/2012 32.5* 36.0 - 46.0 % Final    Physical Exam:  General: alert, cooperative and appears stated age Lochia: appropriate Uterine Fundus: firm Incision: perineum intact DVT Evaluation: No evidence of DVT seen on physical exam. Negative Homan's sign. No cords or calf tenderness. No significant calf/ankle edema.  Discharge Diagnoses: Term Pregnancy-delivered  Discharge Information: Date: 06/13/2012 Activity: unrestricted and pelvic rest Diet: routine Medications: PNV, Ibuprofen, Colace and Percocet Condition: stable Instructions: refer to practice specific booklet Discharge to: home Follow-up Information    Follow up with [facility] Cristie Hem, MD on 06/18/2012.   Contact information:   [address] [place] [address] [phone]          Newborn Data: Live born female  Birth Weight: 6 lb 9 oz (2977 g) APGAR: 9, 9  Home with mother.  Shanele Nissan 06/13/2012, 10:52 AM

## 2012-06-13 NOTE — Progress Notes (Addendum)
Post Partum Day 1 Subjective: no complaints, up ad lib, voiding, tolerating PO and + flatus.  Requests circumcision today. Requests early discharge.  Objective: Blood pressure 84/58, pulse 65, temperature 98 F (36.7 C), temperature source Oral, resp. rate 20, height 4\' 11"  (1.499 m), weight 62.778 kg (138 lb 6.4 oz), SpO2 97.00%, unknown if currently breastfeeding.  Physical Exam:  General: alert, cooperative and appears stated age Lochia: appropriate Uterine Fundus: firm Incision: perineum intact DVT Evaluation: No evidence of DVT seen on physical exam. Negative Homan's sign. No cords or calf tenderness. No significant calf/ankle edema.   Basename 06/12/12 0515 06/11/12 2017  HGB 10.8* 11.4*  HCT 32.5* 34.1*    Assessment/Plan: Plan for discharge tomorrow.  Pt would like to go home early.  Will d/c home today. Circ today.   LOS: 2 days   Natalie Kelley 06/13/2012, 9:17 AM

## 2012-06-14 NOTE — Anesthesia Postprocedure Evaluation (Signed)
  Anesthesia Post-op Note  Patient: Natalie Kelley  Procedure(s) Performed: * Lumbar Epidural for L&D *  Complications: No apparent anesthesia complications

## 2012-06-24 ENCOUNTER — Inpatient Hospital Stay (HOSPITAL_COMMUNITY)
Admission: AD | Admit: 2012-06-24 | Discharge: 2012-06-24 | Disposition: A | Discharge status: Home / Self Care | Payer: BC Managed Care – PPO | Source: Ambulatory Visit | Attending: Obstetrics and Gynecology | Admitting: Obstetrics and Gynecology

## 2012-06-24 ENCOUNTER — Inpatient Hospital Stay (HOSPITAL_COMMUNITY): Payer: BC Managed Care – PPO

## 2012-06-24 ENCOUNTER — Encounter (HOSPITAL_COMMUNITY): Payer: Self-pay | Admitting: *Deleted

## 2012-06-24 DIAGNOSIS — N898 Other specified noninflammatory disorders of vagina: Secondary | ICD-10-CM

## 2012-06-24 DIAGNOSIS — N939 Abnormal uterine and vaginal bleeding, unspecified: Secondary | ICD-10-CM

## 2012-06-24 LAB — CBC
MCH: 24.4 pg — ABNORMAL LOW (ref 26.0–34.0)
MCHC: 32.8 g/dL (ref 30.0–36.0)
Platelets: 440 10*3/uL — ABNORMAL HIGH (ref 150–400)
RBC: 4.14 MIL/uL (ref 3.87–5.11)
RDW: 13.7 % (ref 11.5–15.5)

## 2012-06-24 MED ORDER — LIDOCAINE HCL 2 % EX GEL
Freq: Once | CUTANEOUS | Status: DC
  Filled 2012-06-24: qty 20

## 2012-06-24 NOTE — MAU Provider Note (Signed)
History     CSN: [id number]  Arrival date and time: 06/24/12 0255   None     Chief Complaint  Patient presents with  . Vaginal Bleeding   HPI Pt is 12 days post partum and presents with vaginal bleeding.  Pt had fever 101.16F last Wed 9/27 with mastitis and also cellulitis treated with Keflex.  Tonight she started having heavy bleeding, gushing with clots tonight saturating 2 pads in 30 minutes and then had bleeding down her leg.  She is not having pain or cramping.   Past Medical History  Diagnosis Date  . No pertinent past medical history   . Abnormal pap 2009    Past Surgical History  Procedure Date  . No past surgeries   . Cryotherapy 2009    History reviewed. No pertinent family history.  History  Substance Use Topics  . Smoking status: Never Smoker   . Smokeless tobacco: Never Used  . Alcohol Use: No    Allergies:  Allergies  Allergen Reactions  . Aspirin Hives  . Sulfonamide Derivatives Hives and Other (See Comments)    fever  . Terbutaline Hives    Skin discoloration and skin peeling    Prescriptions prior to admission  Medication Sig Dispense Refill  . cephALEXin (KEFLEX) 500 MG capsule Take 500 mg by mouth 4 (four) times daily.      . Prenatal Vit-Fe Fumarate-FA (PRENATAL MULTIVITAMIN) TABS Take 1 tablet by mouth daily.      Marland Kitchen oxyCODONE-acetaminophen (PERCOCET/ROXICET) 5-325 MG per tablet Take 1-2 tablets by mouth every 3 (three) hours as needed (moderate - severe pain).  20 tablet  0    ROS Physical Exam   Blood pressure 115/70, pulse 82, temperature 98.2 F (36.8 C), temperature source Oral, resp. rate 16, height 4\' 11"  (1.499 m), weight 57.698 kg (127 lb 3.2 oz), unknown if currently breastfeeding.  Physical Exam  MAU Course  Procedures Results for orders placed during the hospital encounter of 06/24/12 (from the past 24 hour(s))  CBC     Status: Abnormal   Collection Time   06/24/12  3:35 AM      Component Value Range   WBC 9.9  4.0 -  10.5 K/uL   RBC 4.14  3.87 - 5.11 MIL/uL   Hemoglobin 10.1 (*) 12.0 - 15.0 g/dL   HCT 62.1 (*) 30.8 - 65.7 %   MCV 74.4 (*) 78.0 - 100.0 fL   MCH 24.4 (*) 26.0 - 34.0 pg   MCHC 32.8  30.0 - 36.0 g/dL   RDW 84.6  96.2 - 95.2 %   Platelets 440 (*) 150 - 400 K/uL   Clinical Data: Postpartum bleeding, post vaginal delivery on  06/12/2012  TRANSABDOMINAL ULTRASOUND OF PELVIS  Technique: Transabdominal ultrasound examination of the pelvis was  performed including evaluation of the uterus, ovaries, adnexal  regions, and pelvic cul-de-sac. Transvaginal imaging deferred due  to infected episiotomy.  Comparison: None  Findings:  Uterus: 11.6 cm length by 6.6 cm AP by 7.3 cm transverse.  Enlarged postpartum in appearance. No focal mass.  Endometrium: Abnormally thickened up to 16 mm, containing fluid and  hypoechoic material question clots/debris. No definite retained  products of conception identified.  Right ovary: 2.5 x 1.6 x 2.3 cm. Normal morphology without mass.  Left ovary: 3.1 x 2.0 x 2.3 cm. Normal morphology without mass.  Other Findings: No free pelvic fluid or adnexal masses.  IMPRESSION:  Enlarged postpartum uterus with a thickened endometrial complex  containing  fluid and hypoechoic material question clot/debris.  No definite retained products of conception identified.  Unremarkable ovaries.  Original Report Authenticated By: Lollie Marrow, M.D.  Discussed with Dr. Henderson Cloud- pt may go home and f/u with Dr. Henderson Cloud on thurs morning-discussed with pt and husband- will call sooner if increase in pain, bleeding or fever Lidocaine 2% jelly applied to sutures  Assessment and Plan  Post partum bleeding- rest,fluid and follow up with Dr. Henderson Cloud on [place] 06/24/2012, 3:30 AM

## 2012-06-24 NOTE — MAU Note (Signed)
Pt G3 P2 post partum vag del 8/23, at 0130 started having heavy bright red bleeding with small clots.  Last week developed fever dx with mastitis and vaginal cellulitis took keflex.

## 2013-01-08 ENCOUNTER — Ambulatory Visit: Payer: BC Managed Care – PPO | Admitting: Family Medicine

## 2013-01-08 DIAGNOSIS — Z0289 Encounter for other administrative examinations: Secondary | ICD-10-CM

## 2013-06-27 IMAGING — US US PELVIS COMPLETE
1 series · 14 of 25 positions shown · non-contrast
Comparison: None

CLINICAL DATA: Postpartum bleeding, post vaginal delivery on
06/12/2012

TRANSABDOMINAL ULTRASOUND OF PELVIS
TECHNIQUE: Transabdominal ultrasound examination of the pelvis was
performed including evaluation of the uterus, ovaries, adnexal
regions, and pelvic cul-de-sac. Transvaginal imaging deferred due
to infected episiotomy.

[Series 1: us pelvis complete · 31 acquisitions, 14 frames shown]
[im 1/31]
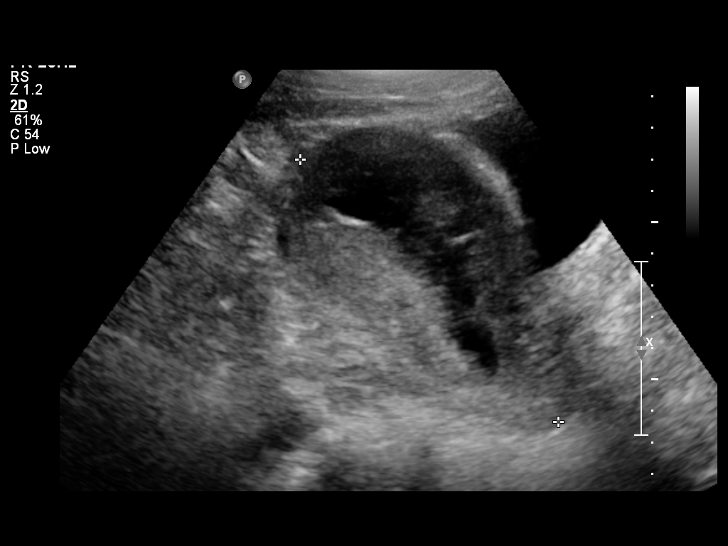
[im 3/31]
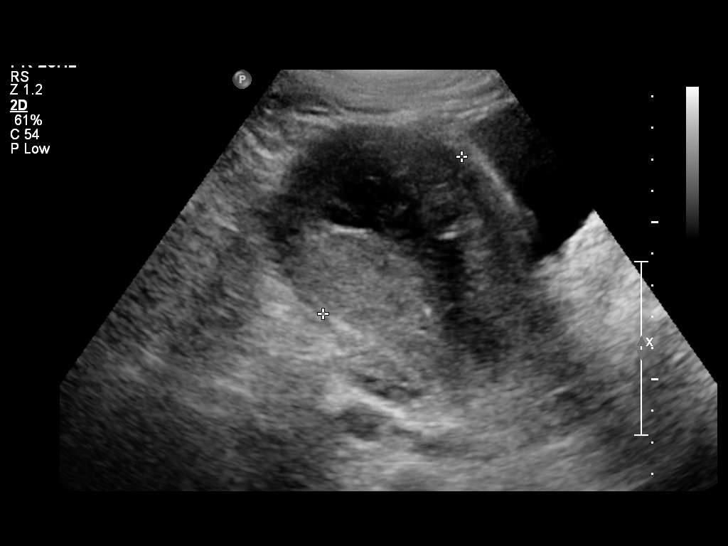
[im 6/31]
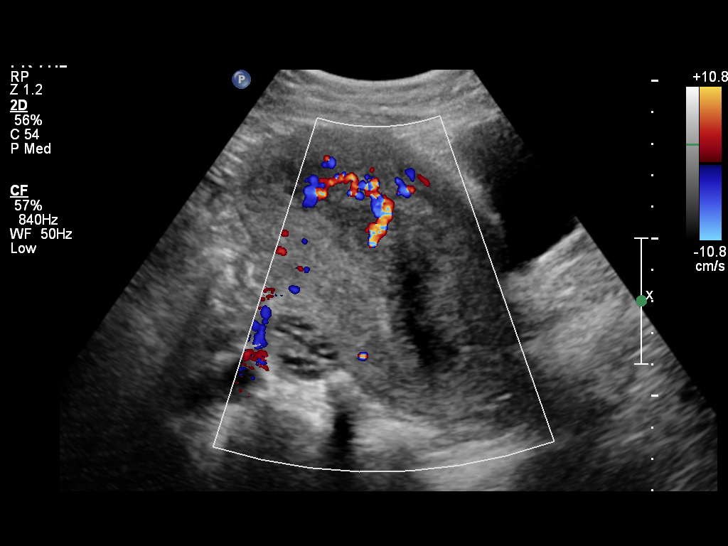
[im 8/31]
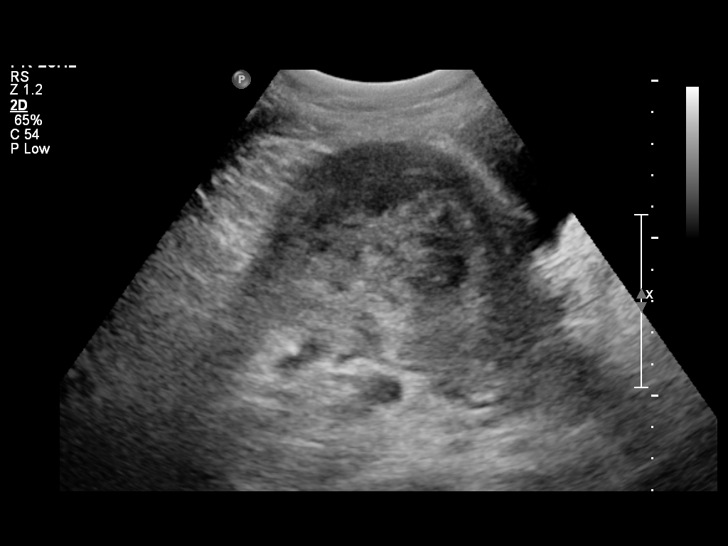
[im 11/31]
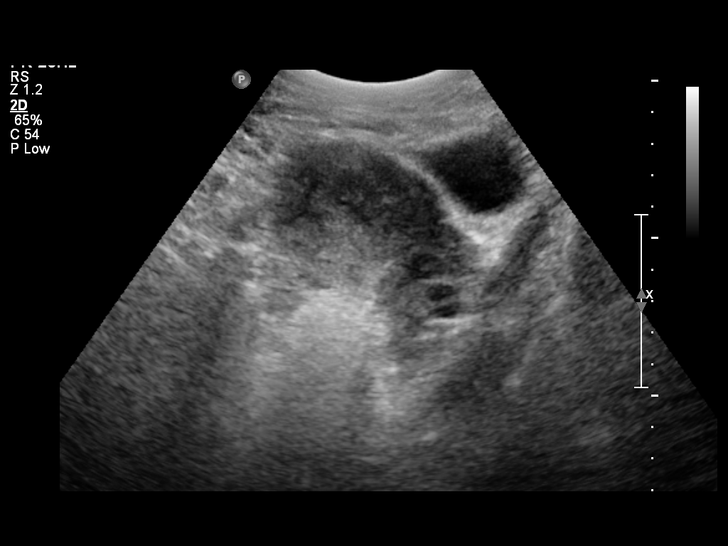
[im 12/31]
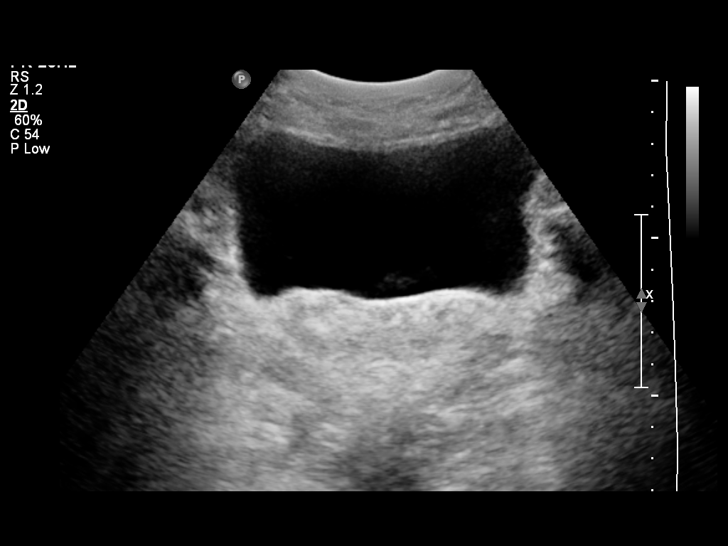
[im 14/31]
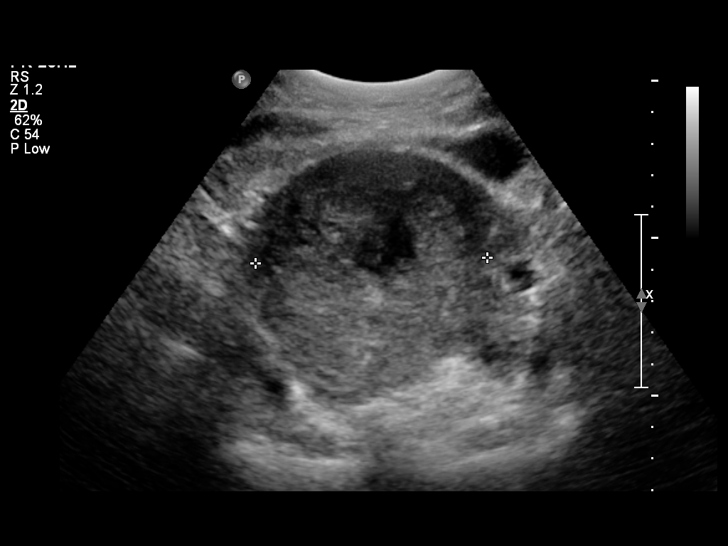
[im 17/31]
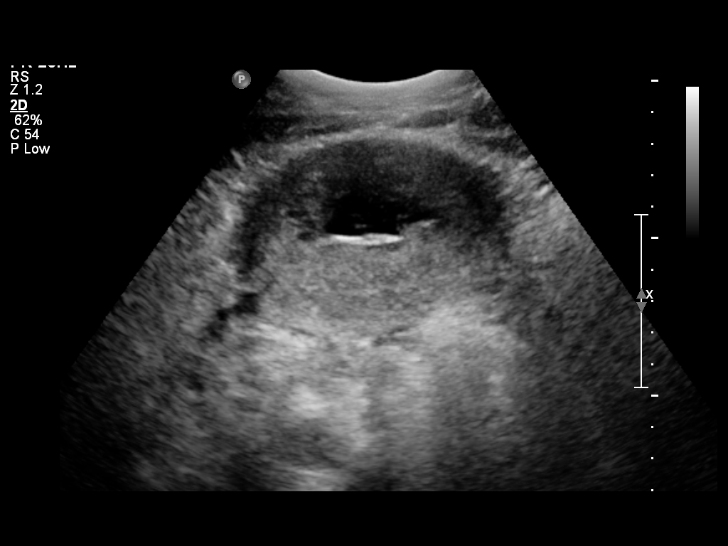
[im 19/31]
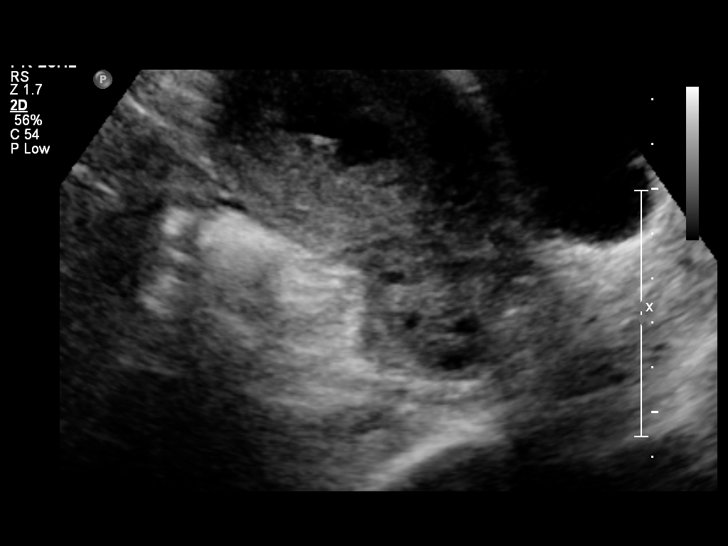
[im 21/31]
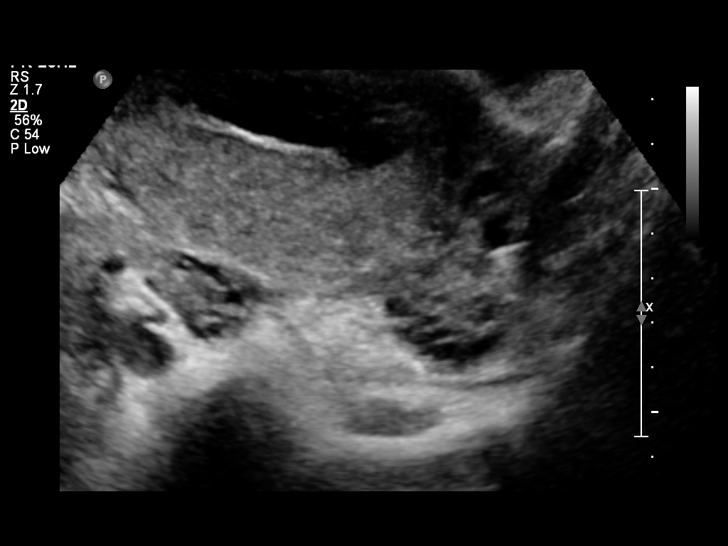
[im 23/31]
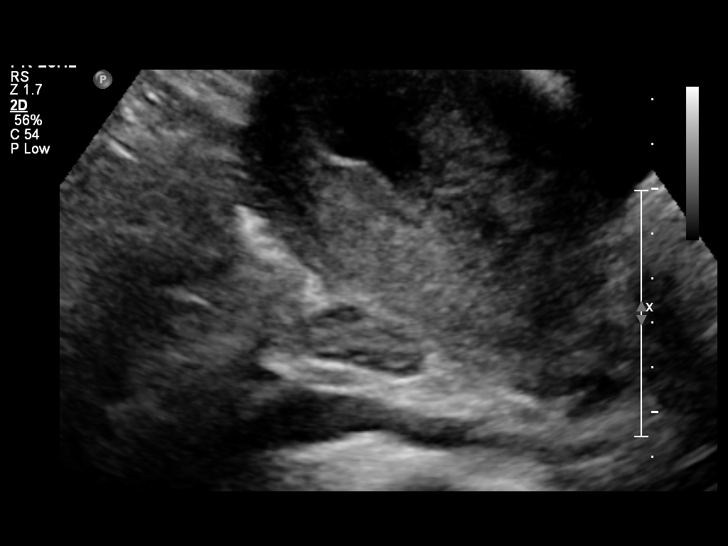
[im 26/31]
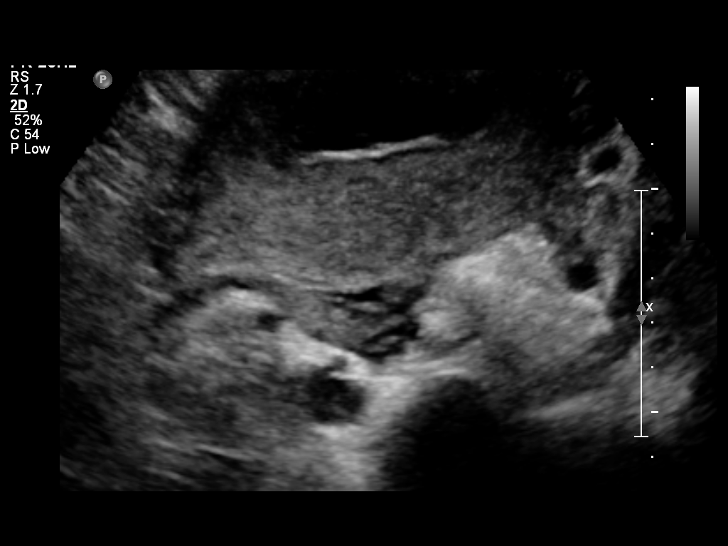
[im 28/31]
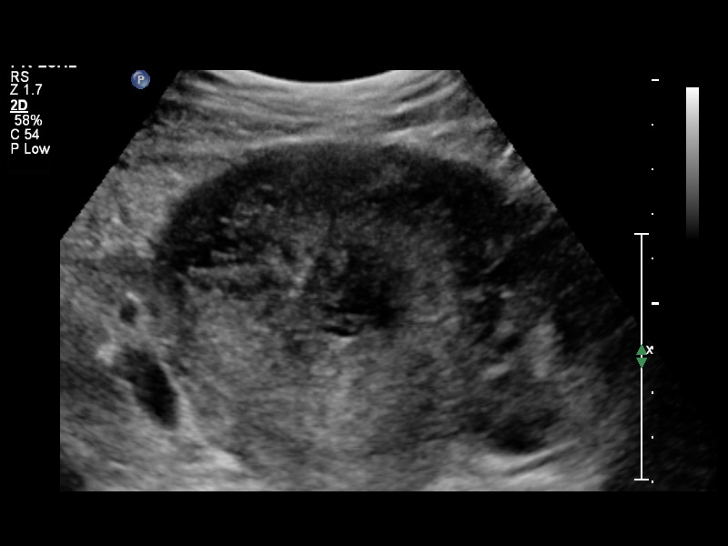
[im 31/31]
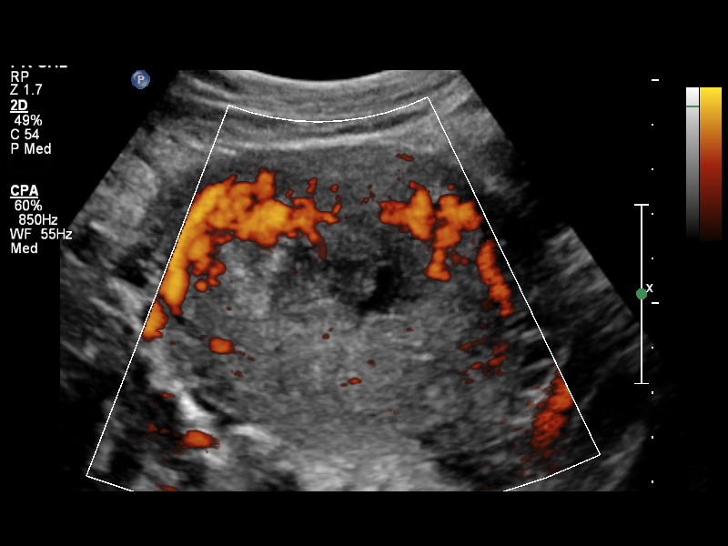

[14 of 25 positions shown; findings below may reference images not displayed]

FINDINGS: Uterus:  11.6 cm length by 6.6 cm AP by 7.3 cm transverse.
Enlarged postpartum in appearance.  No focal mass.

Endometrium: Abnormally thickened up to 16 mm, containing fluid and
hypoechoic material question clots/debris.  No definite retained
products of conception identified.

Right ovary: 2.5 x 1.6 x 2.3 cm.  Normal morphology without mass.

Left ovary: 3.1 x 2.0 x 2.3 cm.  Normal morphology without mass.

Other Findings:  No free pelvic fluid or adnexal masses.
IMPRESSION: Enlarged postpartum uterus with a thickened endometrial complex
containing fluid and hypoechoic material question clot/debris.
No definite retained products of conception identified.
Unremarkable ovaries.

## 2014-08-22 ENCOUNTER — Encounter (HOSPITAL_COMMUNITY): Payer: Self-pay | Admitting: *Deleted

## 2014-10-11 NOTE — H&P (Signed)
Austin MilesLila Aeschliman  DICTATION # [id number] CSN# [id number]   Meriel PicaHOLLAND,Glen Blatchley M, MD 10/11/2014 1:51 PM

## 2014-10-11 NOTE — H&P (Signed)
Natalie Kelley, Natalie Kelley                  ACCOUNT NO.:  [id number]  MEDICAL RECORD NO.:  [id number]  LOCATION:                                 FACILITY:  PHYSICIAN:  Duke Salviaichard M. Marcelle Kelley, M.D.    DATE OF BIRTH:  DATE OF ADMISSION:  10/12/2014 DATE OF DISCHARGE:                             HISTORY & PHYSICAL   CHIEF COMPLAINT:  Missed AB.  HISTORY OF PRESENT ILLNESS:  A 33 year old, G4, P-1-1-1-2, the patient was seen at 6 weeks with a positive FHR by ultrasound, presented today for new OB appointment.  No bleeding or cramping but FHR could not be heard, ultrasound done showing a missed AB, no FHR noted, presents now for D and E.  This procedure including specific risks related to bleeding, infection, other complications such as perforation that may require additional surgery were discussed with her.  She is O positive.  PAST MEDICAL HISTORY:  ALLERGIES:  SULFA, ASPIRIN, TERBUTALINE, MEDERMA.  Please see the [facility] form for the remainder of her past medical history.  PHYSICAL EXAMINATION:  VITAL SIGNS:  Temperature 98.2, blood pressure 120/62. HEENT:  Unremarkable. NECK:  Supple without masses. LUNGS:  Clear. CARDIOVASCULAR:  Regular rate and rhythm without murmurs, rubs, or gallops. BREASTS:  Without masses. ABDOMEN:  Soft, flat, nontender. PELVIC:  Vulva, vagina, cervix normal.  Uterus was 6-7 week size mid position.  Adnexa negative. EXTREMITIES:  Unremarkable. NEUROLOGIC:  Unremarkable.  IMPRESSION:  Missed abortion.  PLAN:  D and E procedure and risks discussed as above.     Natalie Kelley, M.D.     [facility]/MEDQ  D:  10/11/2014  T:  10/11/2014  Job:  [id number]

## 2014-10-12 ENCOUNTER — Encounter (HOSPITAL_COMMUNITY)
Admission: RE | Disposition: A | Discharge status: Home / Self Care | Payer: Self-pay | Source: Ambulatory Visit | Attending: Obstetrics and Gynecology

## 2014-10-12 ENCOUNTER — Ambulatory Visit (HOSPITAL_COMMUNITY): Payer: BC Managed Care – PPO | Admitting: Anesthesiology

## 2014-10-12 ENCOUNTER — Encounter (HOSPITAL_COMMUNITY): Payer: Self-pay | Admitting: *Deleted

## 2014-10-12 ENCOUNTER — Ambulatory Visit (HOSPITAL_COMMUNITY)
Admission: RE | Admit: 2014-10-12 | Discharge: 2014-10-12 | Disposition: A | Discharge status: Home / Self Care | Payer: BC Managed Care – PPO | Source: Ambulatory Visit | Attending: Obstetrics and Gynecology | Admitting: Obstetrics and Gynecology

## 2014-10-12 DIAGNOSIS — Z884 Allergy status to anesthetic agent status: Secondary | ICD-10-CM | POA: Diagnosis not present

## 2014-10-12 DIAGNOSIS — Z882 Allergy status to sulfonamides status: Secondary | ICD-10-CM | POA: Diagnosis not present

## 2014-10-12 DIAGNOSIS — O021 Missed abortion: Secondary | ICD-10-CM | POA: Insufficient documentation

## 2014-10-12 DIAGNOSIS — Z888 Allergy status to other drugs, medicaments and biological substances status: Secondary | ICD-10-CM | POA: Diagnosis not present

## 2014-10-12 HISTORY — PX: DILATION AND EVACUATION: SHX1459

## 2014-10-12 LAB — CBC
HEMATOCRIT: 36.4 % (ref 36.0–46.0)
HEMOGLOBIN: 12.4 g/dL (ref 12.0–15.0)
MCH: 25.4 pg — ABNORMAL LOW (ref 26.0–34.0)
MCHC: 34.1 g/dL (ref 30.0–36.0)
MCV: 74.4 fL — AB (ref 78.0–100.0)
PLATELETS: 283 10*3/uL (ref 150–400)
RBC: 4.89 MIL/uL (ref 3.87–5.11)
RDW: 14 % (ref 11.5–15.5)
WBC: 8.9 10*3/uL (ref 4.0–10.5)

## 2014-10-12 SURGERY — DILATION AND EVACUATION, UTERUS
Anesthesia: Monitor Anesthesia Care | Site: Vagina

## 2014-10-12 MED ORDER — FENTANYL CITRATE 0.05 MG/ML IJ SOLN
INTRAMUSCULAR | Status: AC
  Filled 2014-10-12: qty 2

## 2014-10-12 MED ORDER — LIDOCAINE HCL 1 % IJ SOLN
INTRAMUSCULAR | Status: DC | PRN
  Administered 2014-10-12: 9 mL

## 2014-10-12 MED ORDER — DEXAMETHASONE SODIUM PHOSPHATE 10 MG/ML IJ SOLN
INTRAMUSCULAR | Status: AC
  Filled 2014-10-12: qty 1

## 2014-10-12 MED ORDER — DEXAMETHASONE SODIUM PHOSPHATE 10 MG/ML IJ SOLN
INTRAMUSCULAR | Status: DC | PRN
  Administered 2014-10-12: 10 mg via INTRAVENOUS

## 2014-10-12 MED ORDER — ONDANSETRON HCL 4 MG/2ML IJ SOLN
INTRAMUSCULAR | Status: DC | PRN
  Administered 2014-10-12: 4 mg via INTRAVENOUS

## 2014-10-12 MED ORDER — KETOROLAC TROMETHAMINE 30 MG/ML IJ SOLN
INTRAMUSCULAR | Status: AC
  Filled 2014-10-12: qty 1

## 2014-10-12 MED ORDER — PROPOFOL 10 MG/ML IV EMUL
INTRAVENOUS | Status: AC
  Filled 2014-10-12: qty 20

## 2014-10-12 MED ORDER — SCOPOLAMINE 1 MG/3DAYS TD PT72
MEDICATED_PATCH | TRANSDERMAL | Status: AC
  Administered 2014-10-12: 1.5 mg via TRANSDERMAL
  Filled 2014-10-12: qty 1

## 2014-10-12 MED ORDER — ONDANSETRON HCL 4 MG/2ML IJ SOLN
INTRAMUSCULAR | Status: AC
  Filled 2014-10-12: qty 2

## 2014-10-12 MED ORDER — MIDAZOLAM HCL 2 MG/2ML IJ SOLN
INTRAMUSCULAR | Status: AC
  Filled 2014-10-12: qty 2

## 2014-10-12 MED ORDER — MIDAZOLAM HCL 2 MG/2ML IJ SOLN
INTRAMUSCULAR | Status: DC | PRN
  Administered 2014-10-12: 2 mg via INTRAVENOUS

## 2014-10-12 MED ORDER — OXYCODONE HCL 5 MG/5ML PO SOLN: 5.0000 mg | Freq: Once | ORAL | Status: DC | PRN

## 2014-10-12 MED ORDER — SCOPOLAMINE 1 MG/3DAYS TD PT72
1.0000 | MEDICATED_PATCH | Freq: Once | TRANSDERMAL | Status: DC
  Administered 2014-10-12: 1.5 mg via TRANSDERMAL

## 2014-10-12 MED ORDER — LIDOCAINE HCL (CARDIAC) 20 MG/ML IV SOLN
INTRAVENOUS | Status: DC | PRN
  Administered 2014-10-12: 80 mg via INTRAVENOUS

## 2014-10-12 MED ORDER — LIDOCAINE HCL (CARDIAC) 20 MG/ML IV SOLN
INTRAVENOUS | Status: AC
  Filled 2014-10-12: qty 5

## 2014-10-12 MED ORDER — LACTATED RINGERS IV SOLN
INTRAVENOUS | Status: DC
  Administered 2014-10-12 (×2): via INTRAVENOUS

## 2014-10-12 MED ORDER — MEPERIDINE HCL 25 MG/ML IJ SOLN: 6.2500 mg | INTRAMUSCULAR | Status: DC | PRN

## 2014-10-12 MED ORDER — KETOROLAC TROMETHAMINE 30 MG/ML IJ SOLN
INTRAMUSCULAR | Status: DC | PRN
  Administered 2014-10-12: 30 mg via INTRAVENOUS

## 2014-10-12 MED ORDER — OXYCODONE HCL 5 MG PO TABS: 5.0000 mg | ORAL_TABLET | Freq: Once | ORAL | Status: DC | PRN

## 2014-10-12 MED ORDER — FENTANYL CITRATE 0.05 MG/ML IJ SOLN
INTRAMUSCULAR | Status: DC | PRN
  Administered 2014-10-12 (×2): 50 ug via INTRAVENOUS

## 2014-10-12 MED ORDER — LIDOCAINE HCL 1 % IJ SOLN
INTRAMUSCULAR | Status: AC
  Filled 2014-10-12: qty 20

## 2014-10-12 MED ORDER — FENTANYL CITRATE 0.05 MG/ML IJ SOLN: 25.0000 ug | INTRAMUSCULAR | Status: DC | PRN

## 2014-10-12 MED ORDER — ONDANSETRON HCL 4 MG/2ML IJ SOLN: 4.0000 mg | Freq: Once | INTRAMUSCULAR | Status: DC | PRN

## 2014-10-12 MED ORDER — PROPOFOL 10 MG/ML IV EMUL
INTRAVENOUS | Status: DC | PRN
  Administered 2014-10-12: 30 mg via INTRAVENOUS
  Administered 2014-10-12 (×2): 20 mg via INTRAVENOUS
  Administered 2014-10-12 (×2): 30 mg via INTRAVENOUS
  Administered 2014-10-12: 10 mg via INTRAVENOUS
  Administered 2014-10-12 (×2): 20 mg via INTRAVENOUS

## 2014-10-12 MED ORDER — OXYCODONE-ACETAMINOPHEN 2.5-325 MG PO TABS: 1.0000 | ORAL_TABLET | ORAL | Status: DC | PRN

## 2014-10-12 SURGICAL SUPPLY — 17 items
CATH ROBINSON RED A/P 16FR (CATHETERS) ×2 IMPLANT
CLOTH BEACON ORANGE TIMEOUT ST (SAFETY) ×2 IMPLANT
DECANTER SPIKE VIAL GLASS SM (MISCELLANEOUS) ×2 IMPLANT
GLOVE BIO SURGEON STRL SZ7 (GLOVE) ×4 IMPLANT
GOWN STRL REUS W/TWL LRG LVL3 (GOWN DISPOSABLE) ×4 IMPLANT
KIT BERKELEY 1ST TRIMESTER 3/8 (MISCELLANEOUS) ×2 IMPLANT
NS IRRIG 1000ML POUR BTL (IV SOLUTION) ×2 IMPLANT
PACK VAGINAL MINOR WOMEN LF (CUSTOM PROCEDURE TRAY) ×2 IMPLANT
PAD OB MATERNITY 4.3X12.25 (PERSONAL CARE ITEMS) ×2 IMPLANT
PAD PREP 24X48 CUFFED NSTRL (MISCELLANEOUS) ×2 IMPLANT
SET BERKELEY SUCTION TUBING (SUCTIONS) ×2 IMPLANT
SUT VIC AB 2-0 UR6 27 (SUTURE) ×1 IMPLANT
TOWEL OR 17X24 6PK STRL BLUE (TOWEL DISPOSABLE) ×4 IMPLANT
VACURETTE 10 RIGID CVD (CANNULA) IMPLANT
VACURETTE 7MM CVD STRL WRAP (CANNULA) ×1 IMPLANT
VACURETTE 8 RIGID CVD (CANNULA) IMPLANT
VACURETTE 9 RIGID CVD (CANNULA) IMPLANT

## 2014-10-12 NOTE — Discharge Instructions (Signed)

## 2014-10-12 NOTE — Op Note (Signed)
Preoperative diagnosis: Missed AB  Postoperative diagnosis: Same  Procedure: D&E  Surgeon: Marcelle OverlieHolland  Anesthesia: Sedation plus paracervical block  Specimens removed: Products of conception, to pathology  Procedure and findings:  Patient taken to the operating room after an adequate level of sedation was obtained with leg stirrups the perineum and vagina were prepped and draped in usual fashion bladder was drained. Appropriate timeouts taken that point, EUA carried out uterus was 6 weeks size, mid position, adnexa negative. Speculum was positioned cervix grasped with tenaculum, paracervical block was then created by infiltrating at 3 and 9:00 submucosally, 5-7 cc 1% plain Xylocaine at each site after negative aspiration. Uterus was sounded to 8 cm, progressively dilated to a 27 Pratt dilator, a 7 curved suction curet was then used to curette a moderate amount of tissue. When no further tissue could be removed, a medium blunt curette was used to explore the cavity revealing it to be clean there was minimal bleeding, tenaculum site was sutured on the way out. This was hemostatic she tolerated this well, went to recovery room in good condition.  Dictated with [facility]  Abaigeal Moomaw Milana ObeyM Ashyr Hedgepath M.D.

## 2014-10-12 NOTE — Transfer of Care (Signed)
Immediate Anesthesia Transfer of Care Note  Patient: Natalie MilesLila Bratton  Procedure(s) Performed: Procedure(s): DILATATION AND EVACUATION (N/A)  Patient Location: PACU  Anesthesia Type:MAC  Level of Consciousness: awake, alert  and oriented  Airway & Oxygen Therapy: Patient Spontanous Breathing and Patient connected to nasal cannula oxygen  Post-op Assessment: Report given to PACU RN, Post -op Vital signs reviewed and stable and Patient moving all extremities  Post vital signs: Reviewed and stable  Complications: No apparent anesthesia complications

## 2014-10-12 NOTE — Anesthesia Postprocedure Evaluation (Signed)
  Anesthesia Post Note  Patient: Natalie MilesLila Snedeker  Procedure(s) Performed: Procedure(s) (LRB): DILATATION AND EVACUATION (N/A)  Anesthesia type: mac   Patient location: PACU  Post pain: Pain level controlled  Post assessment: Post-op Vital signs reviewed  Last Vitals:  Filed Vitals:   10/12/14 0755  BP:   Pulse:   Temp: 36.3 C  Resp:     Post vital signs: Reviewed  Level of consciousness: sedated  Complications: No apparent anesthesia complications

## 2014-10-12 NOTE — Progress Notes (Signed)
The patient was re-examined with no change in status 

## 2014-10-12 NOTE — Anesthesia Preprocedure Evaluation (Signed)
Anesthesia Evaluation  Patient identified by MRN, date of birth, ID band  Reviewed: Allergy & Precautions, H&P , NPO status , Patient's Chart, lab work & pertinent test results  Airway Mallampati: I  TM Distance: >3 FB Neck ROM: full    Dental no notable dental hx. (+) Teeth Intact   Pulmonary neg pulmonary ROS,    Pulmonary exam normal       Cardiovascular negative cardio ROS      Neuro/Psych negative neurological ROS  negative psych ROS   GI/Hepatic negative GI ROS, Neg liver ROS,   Endo/Other  negative endocrine ROS  Renal/GU negative Renal ROS     Musculoskeletal   Abdominal Normal abdominal exam  (+)   Peds  Hematology negative hematology ROS (+)   Anesthesia Other Findings   Reproductive/Obstetrics negative OB ROS                             Anesthesia Physical Anesthesia Plan  ASA: II  Anesthesia Plan: MAC   Post-op Pain Management:    Induction: Intravenous  Airway Management Planned:   Additional Equipment:   Intra-op Plan:   Post-operative Plan:   Informed Consent: I have reviewed the patients History and Physical, chart, labs and discussed the procedure including the risks, benefits and alternatives for the proposed anesthesia with the patient or authorized representative who has indicated his/her understanding and acceptance.     Plan Discussed with: CRNA, Surgeon and Anesthesiologist  Anesthesia Plan Comments:         Anesthesia Quick Evaluation

## 2014-10-14 ENCOUNTER — Encounter (HOSPITAL_COMMUNITY): Payer: Self-pay | Admitting: Obstetrics and Gynecology

## 2015-03-13 ENCOUNTER — Ambulatory Visit (INDEPENDENT_AMBULATORY_CARE_PROVIDER_SITE_OTHER): Payer: BLUE CROSS/BLUE SHIELD | Admitting: Family Medicine

## 2015-03-13 ENCOUNTER — Encounter: Payer: Self-pay | Admitting: Family Medicine

## 2015-03-13 ENCOUNTER — Other Ambulatory Visit (HOSPITAL_COMMUNITY)
Admission: RE | Admit: 2015-03-13 | Discharge: 2015-03-13 | Disposition: A | Discharge status: Home / Self Care | Payer: BLUE CROSS/BLUE SHIELD | Source: Ambulatory Visit | Attending: Family Medicine | Admitting: Family Medicine

## 2015-03-13 VITALS — BP 94/62 | HR 62 | Temp 97.8°F | Ht 59.5 in | Wt 128.5 lb

## 2015-03-13 DIAGNOSIS — Z800 Family history of malignant neoplasm of digestive organs: Secondary | ICD-10-CM | POA: Insufficient documentation

## 2015-03-13 DIAGNOSIS — Z1151 Encounter for screening for human papillomavirus (HPV): Secondary | ICD-10-CM | POA: Insufficient documentation

## 2015-03-13 DIAGNOSIS — Z01419 Encounter for gynecological examination (general) (routine) without abnormal findings: Secondary | ICD-10-CM | POA: Diagnosis not present

## 2015-03-13 DIAGNOSIS — Z0000 Encounter for general adult medical examination without abnormal findings: Secondary | ICD-10-CM

## 2015-03-13 LAB — CBC WITH DIFFERENTIAL/PLATELET
BASOS ABS: 0 10*3/uL (ref 0.0–0.1)
Basophils Relative: 0.4 % (ref 0.0–3.0)
EOS PCT: 1 % (ref 0.0–5.0)
Eosinophils Absolute: 0.1 10*3/uL (ref 0.0–0.7)
HEMATOCRIT: 39.4 % (ref 36.0–46.0)
Hemoglobin: 12.9 g/dL (ref 12.0–15.0)
Lymphocytes Relative: 37.5 % (ref 12.0–46.0)
Lymphs Abs: 2.3 10*3/uL (ref 0.7–4.0)
MCHC: 32.8 g/dL (ref 30.0–36.0)
MCV: 75.8 fl — AB (ref 78.0–100.0)
MONOS PCT: 6.5 % (ref 3.0–12.0)
Monocytes Absolute: 0.4 10*3/uL (ref 0.1–1.0)
NEUTROS ABS: 3.4 10*3/uL (ref 1.4–7.7)
NEUTROS PCT: 54.6 % (ref 43.0–77.0)
Platelets: 324 10*3/uL (ref 150.0–400.0)
RBC: 5.2 Mil/uL — ABNORMAL HIGH (ref 3.87–5.11)
RDW: 14.4 % (ref 11.5–15.5)
WBC: 6.2 10*3/uL (ref 4.0–10.5)

## 2015-03-13 LAB — COMPREHENSIVE METABOLIC PANEL
ALBUMIN: 4.3 g/dL (ref 3.5–5.2)
ALT: 11 U/L (ref 0–35)
AST: 13 U/L (ref 0–37)
Alkaline Phosphatase: 66 U/L (ref 39–117)
BUN: 16 mg/dL (ref 6–23)
CO2: 29 mEq/L (ref 19–32)
CREATININE: 0.79 mg/dL (ref 0.40–1.20)
Calcium: 9.6 mg/dL (ref 8.4–10.5)
Chloride: 103 mEq/L (ref 96–112)
GFR: 89.17 mL/min (ref >60.00)
GLUCOSE: 99 mg/dL (ref 70–99)
POTASSIUM: 3.8 meq/L (ref 3.5–5.1)
Sodium: 136 mEq/L (ref 135–145)
Total Bilirubin: 0.5 mg/dL (ref 0.2–1.2)
Total Protein: 7.8 g/dL (ref 6.0–8.3)

## 2015-03-13 LAB — LIPID PANEL
CHOL/HDL RATIO: 4
Cholesterol: 199 mg/dL (ref 0–200)
HDL: 55.1 mg/dL (ref >39.00)
LDL Cholesterol: 121 mg/dL — ABNORMAL HIGH (ref 0–99)
NONHDL: 143.9
TRIGLYCERIDES: 115 mg/dL (ref 0.0–149.0)
VLDL: 23 mg/dL (ref 0.0–40.0)

## 2015-03-13 LAB — HM PAP SMEAR: HM PAP: NORMAL

## 2015-03-13 LAB — TSH: TSH: 0.73 u[IU]/mL (ref 0.35–4.50)

## 2015-03-13 NOTE — Progress Notes (Signed)
Pre visit review using our clinic review tool, if applicable. No additional management support is needed unless otherwise documented below in the visit note. 

## 2015-03-13 NOTE — Assessment & Plan Note (Signed)
Annual pap /exam  Remote hx of colposcopy -back on annual schedule  No contraception  Lost pregnancy in Dec

## 2015-03-13 NOTE — Assessment & Plan Note (Signed)
Reviewed health habits including diet and exercise and skin cancer prevention Reviewed appropriate screening tests for age  Also reviewed health mt list, fam hx and immunization status , as well as social and family history   See HPI Wellness lab today 

## 2015-03-13 NOTE — Assessment & Plan Note (Signed)
Father had colon cancer in early 2340s  Will ref to GI to disc screening/colonoscopy

## 2015-03-13 NOTE — Progress Notes (Signed)
Subjective:    Patient ID: Natalie MilesLila Kelley, female    DOB: 01-05-1981, 34 y.o.   MRN: [id number]  HPI Here for health maintenance exam and to review chronic medical problems    Is working a lot  Is going to go back to [employer] in the summer and poss [facility] [employer]   Has been feeling fine   Flu shot this fall   Td 7/09   Wt is up 2 lb with bmi of 25  Is trying to exercise  Wants to loose weight  She eats to stay awake at the hospital / and that is a rough habit to break   Is not interested in HIV screening  Does want wellness labs   Father had colon cancer at 7344 - was stage 4 with mets  Is interested in colonoscopy   Is due for a pap smear Has had dysplasia in the past and colp  Lost a pregnancy at 10 weeks - had to have a D and C Not sure why she lost it  Has 2 kids- she did have some complications with those  Dr Marcelle OverlieHolland   Last pap was normal  Was back to a yearly schedule   Patient Active Problem List   Diagnosis Date Noted  . Routine general medical examination at a health care facility 03/13/2015  . Encounter for routine gynecological examination 03/13/2015  . Family history of colon cancer 03/13/2015  . ABNORMAL GLANDULAR PAPANICOLAOU SMEAR OF CERVIX 05/03/2008  . FIBROCYSTIC BREAST DISEASE 02/05/2007   Past Medical History  Diagnosis Date  . No pertinent past medical history   . Abnormal pap 2009   Past Surgical History  Procedure Laterality Date  . No past surgeries    . Cryotherapy  2009  . Dilation and evacuation N/A 10/12/2014    Procedure: DILATATION AND EVACUATION;  Surgeon: Meriel Picaichard M Holland, MD;  Location: [facility];  Service: Gynecology;  Laterality: N/A;   History  Substance Use Topics  . Smoking status: Never Smoker   . Smokeless tobacco: Never Used  . Alcohol Use: No   No family history on file. Allergies  Allergen Reactions  . Aspirin Hives  . Sulfonamide Derivatives Hives and Other (See Comments)    fever  . Terbutaline Hives    Skin  discoloration and skin peeling   No current outpatient prescriptions on file prior to visit.   No current facility-administered medications on file prior to visit.     Review of Systems Review of Systems  Constitutional: Negative for fever, appetite change, fatigue and unexpected weight change.  Eyes: Negative for pain and visual disturbance.  Respiratory: Negative for cough and shortness of breath.   Cardiovascular: Negative for cp or palpitations    Gastrointestinal: Negative for nausea, diarrhea and constipation.  Genitourinary: Negative for urgency and frequency.  Skin: Negative for pallor or rash   Neurological: Negative for weakness, light-headedness, numbness and headaches.  Hematological: Negative for adenopathy. Does not bruise/bleed easily.  Psychiatric/Behavioral: Negative for dysphoric mood. The patient is not nervous/anxious.         Objective:   Physical Exam  Constitutional: She appears well-developed and well-nourished. No distress.  HENT:  Head: Normocephalic and atraumatic.  Right Ear: External ear normal.  Left Ear: External ear normal.  Mouth/Throat: Oropharynx is clear and moist.  Eyes: Conjunctivae and EOM are normal. Pupils are equal, round, and reactive to light. No scleral icterus.  Neck: Normal range of motion. Neck supple. No JVD present.  Carotid bruit is not present. No thyromegaly present.  Cardiovascular: Normal rate, regular rhythm, normal heart sounds and intact distal pulses.  Exam reveals no gallop.   Pulmonary/Chest: Effort normal and breath sounds normal. No respiratory distress. She has no wheezes. She exhibits no tenderness.  Abdominal: Soft. Bowel sounds are normal. She exhibits no distension, no abdominal bruit and no mass. There is no tenderness.  Genitourinary: Vagina normal and uterus normal. No breast swelling, tenderness, discharge or bleeding. There is no rash, tenderness or lesion on the right labia. There is no rash, tenderness or  lesion on the left labia. Uterus is not enlarged and not tender. Cervix exhibits no motion tenderness, no discharge and no friability. Right adnexum displays no mass, no tenderness and no fullness. Left adnexum displays no mass, no tenderness and no fullness. No erythema or tenderness in the vagina. No vaginal discharge found.  Breast exam: No mass, nodules, thickening, tenderness, bulging, retraction, inflamation, nipple discharge or skin changes noted.  No axillary or clavicular LA.      Musculoskeletal: Normal range of motion. She exhibits no edema or tenderness.  Lymphadenopathy:    She has no cervical adenopathy.  Neurological: She is alert. She has normal reflexes. No cranial nerve deficit. She exhibits normal muscle tone. Coordination normal.  Skin: Skin is warm and dry. No rash noted. No erythema. No pallor.  Psychiatric: She has a normal mood and affect.          Assessment & Plan:   Problem List Items Addressed This Visit    Encounter for routine gynecological examination    Annual pap /exam  Remote hx of colposcopy -back on annual schedule  No contraception  Lost pregnancy in Dec        Relevant Orders   Cytology - PAP   Family history of colon cancer    Father had colon cancer in early 4s  Will ref to GI to disc screening/colonoscopy      Relevant Orders   Ambulatory referral to Gastroenterology   Routine general medical examination at a health care facility - Primary    Reviewed health habits including diet and exercise and skin cancer prevention Reviewed appropriate screening tests for age  Also reviewed health mt list, fam hx and immunization status , as well as social and family history   See HPI Wellness lab today      Relevant Orders   Comprehensive metabolic panel (Completed)   TSH (Completed)   CBC with Differential/Platelet (Completed)   Lipid panel (Completed)

## 2015-03-13 NOTE — Patient Instructions (Signed)
Labs today  Take care of yourself  Continue healthy diet and exercise  Pap done today

## 2015-03-14 ENCOUNTER — Encounter: Payer: Self-pay | Admitting: *Deleted

## 2015-03-14 LAB — CYTOLOGY - PAP

## 2015-03-16 ENCOUNTER — Encounter: Payer: Self-pay | Admitting: *Deleted

## 2015-03-16 ENCOUNTER — Encounter: Payer: Self-pay | Admitting: Family Medicine

## 2015-04-03 ENCOUNTER — Encounter: Payer: Self-pay | Admitting: Gastroenterology

## 2015-06-14 ENCOUNTER — Ambulatory Visit (INDEPENDENT_AMBULATORY_CARE_PROVIDER_SITE_OTHER): Payer: BLUE CROSS/BLUE SHIELD | Admitting: Gastroenterology

## 2015-06-14 ENCOUNTER — Encounter: Payer: Self-pay | Admitting: Gastroenterology

## 2015-06-14 VITALS — BP 100/60 | HR 72 | Ht 59.5 in | Wt 128.2 lb

## 2015-06-14 DIAGNOSIS — Z800 Family history of malignant neoplasm of digestive organs: Secondary | ICD-10-CM | POA: Diagnosis not present

## 2015-06-14 MED ORDER — NA SULFATE-K SULFATE-MG SULF 17.5-3.13-1.6 GM/177ML PO SOLN
1.0000 | Freq: Once | ORAL | Status: DC
Start: 2015-06-14 — End: 2015-11-10

## 2015-06-14 NOTE — Assessment & Plan Note (Signed)
Plan screening colonoscopy  CC Dr. Milinda Antis

## 2015-06-14 NOTE — Progress Notes (Signed)
    _                                                                                                                History of Present Illness:  Ms. Ebey is a pleasant 34 year old female referred at the request of Dr. Milinda Antis for colonoscopy.  Family history is pertinent for her father who developed colon cancer in his 30s.  No other family member or relative has colon cancer or other tumors although her family history is somewhat sketchy.  She has no GI complaints including abdominal pain or rectal bleeding.   Past Medical History  Diagnosis Date  . No pertinent past medical history   . Abnormal pap 2009  . Ectopic pregnancy   . Miscarriage within last 12 months    Past Surgical History  Procedure Laterality Date  . Cryotherapy  2009  . Dilation and evacuation N/A 10/12/2014    Procedure: DILATATION AND EVACUATION;  Surgeon: Meriel Pica, MD;  Location: [facility];  Service: Gynecology;  Laterality: N/A;   family history includes Atrial fibrillation in her mother; Autism in her son; Colon cancer in her father; Esophageal varices in her father; Hypertension in her father; Stroke in her father. No current outpatient prescriptions on file.   No current facility-administered medications for this visit.   Allergies as of 06/14/2015 - Review Complete 06/14/2015  Allergen Reaction Noted  . Aspirin Hives 04/01/2008  . Sulfonamide derivatives Hives and Other (See Comments) 04/01/2008  . Terbutaline Hives 11/28/2011    reports that she has quit smoking. Her smoking use included Cigarettes. She has a 1.5 pack-year smoking history. She has never used smokeless tobacco. She reports that she drinks alcohol. She reports that she does not use illicit drugs.   Review of Systems: Pertinent positive and negative review of systems were noted in the above HPI section. All other review of systems were otherwise negative.  Vital signs were reviewed in today's medical record Physical  Exam: General: Well developed , well nourished, no acute distress Skin: anicteric Head: Normocephalic and atraumatic Eyes:  sclerae anicteric, EOMI Ears: Normal auditory acuity Mouth: No deformity or lesions Neck: Supple, no masses or thyromegaly Lymph Nodes: no lymphadenopathy Lungs: Clear throughout to auscultation Heart: Regular rate and rhythm; no murmurs, rubs or bruits Gastroinestinal: Soft, non tender and non distended. No masses, hepatosplenomegaly or hernias noted. Normal Bowel sounds Rectal:deferred Musculoskeletal: Symmetrical with no gross deformities  Skin: No lesions on visible extremities Pulses:  Normal pulses noted Extremities: No clubbing, cyanosis, edema or deformities noted Neurological: Alert oriented x 4, grossly nonfocal Cervical Nodes:  No significant cervical adenopathy Inguinal Nodes: No significant inguinal adenopathy Psychological:  Alert and cooperative. Normal mood and affect  See Assessment and Plan under Problem List

## 2015-06-14 NOTE — Patient Instructions (Signed)

## 2015-06-16 ENCOUNTER — Encounter: Payer: Self-pay | Admitting: Gastroenterology

## 2015-08-21 ENCOUNTER — Encounter: Payer: BLUE CROSS/BLUE SHIELD | Admitting: Gastroenterology

## 2015-09-28 ENCOUNTER — Ambulatory Visit (INDEPENDENT_AMBULATORY_CARE_PROVIDER_SITE_OTHER): Payer: BLUE CROSS/BLUE SHIELD | Admitting: Internal Medicine

## 2015-09-28 ENCOUNTER — Encounter: Payer: Self-pay | Admitting: Internal Medicine

## 2015-09-28 VITALS — BP 116/74 | HR 120 | Temp 98.5°F | Wt 131.0 lb

## 2015-09-28 DIAGNOSIS — J069 Acute upper respiratory infection, unspecified: Secondary | ICD-10-CM | POA: Diagnosis not present

## 2015-09-28 MED ORDER — AMOXICILLIN 500 MG PO CAPS: 500.0000 mg | ORAL_CAPSULE | Freq: Three times a day (TID) | ORAL | Status: DC

## 2015-09-28 NOTE — Progress Notes (Signed)
Pre visit review using our clinic review tool, if applicable. No additional management support is needed unless otherwise documented below in the visit note. 

## 2015-09-28 NOTE — Progress Notes (Signed)
HPI  Pt presents to the clinic today with c/o sore throat and cough. This started 4-5 days ago. She denies difficulty swallowing. The cough is productive of yellow mucous. She is blowing yellow mucous out of her nose. She denies fevers but has had some chills and body aches. She has no history of allergies or breathing problems. She has not had sick contacts that she is aware of. She is [redacted] weeks pregnant.  Review of Systems      Past Medical History  Diagnosis Date  . No pertinent past medical history   . Abnormal pap 2009  . Ectopic pregnancy   . Miscarriage within last 12 months     Family History  Problem Relation Age of Onset  . Colon cancer Father   . Esophageal varices Father   . Hypertension Father   . Stroke Father   . Atrial fibrillation Mother   . Autism Son     Social History   Social History  . Marital Status: Married    Spouse Name: N/A  . Number of Children: 2  . Years of Education: N/A   Occupational History  . oncology nurse    Social History Main Topics  . Smoking status: Former Smoker -- 0.50 packs/day for 3 years    Types: Cigarettes  . Smokeless tobacco: Never Used  . Alcohol Use: 0.0 oz/week    0 Standard drinks or equivalent per week     Comment: occasionally  . Drug Use: No  . Sexual Activity: Yes   Other Topics Concern  . Not on file   Social History Narrative    Allergies  Allergen Reactions  . Aspirin Hives  . Sulfonamide Derivatives Hives and Other (See Comments)    fever  . Terbutaline Hives    Skin discoloration and skin peeling     Constitutional: Positive headache. Denies fatigue, fever or abrupt weight changes.  HEENT:  Positive nasal congestion, sore throat. Denies eye redness, eye pain, pressure behind the eyes, facial pain, nasal congestion, ear pain, ringing in the ears, wax buildup, runny nose or bloody nose. Respiratory: Positive cough. Denies difficulty breathing or shortness of breath.  Cardiovascular: Denies  chest pain, chest tightness, palpitations or swelling in the hands or feet.   No other specific complaints in a complete review of systems (except as listed in HPI above).  Objective:   BP 116/74 mmHg  Pulse 120  Temp(Src) 98.5 F (36.9 C) (Oral)  Wt 131 lb (59.421 kg)  SpO2 98%  LMP 03/07/2015  Wt Readings from Last 3 Encounters:  06/14/15 128 lb 4 oz (58.174 kg)  03/13/15 128 lb 8 oz (58.287 kg)  10/11/14 126 lb (57.153 kg)     General: Appears her stated age, in NAD. HEENT: Head: normal shape and size, no sinus tenderness noted; Eyes: sclera white, no icterus, conjunctiva pink; Ears: Tm's gray and intact, normal light reflex; Nose: mucosa pink and moist, septum midline; Throat/Mouth: + PND. Teeth present, mucosa erythematous and moist, no exudate noted, no lesions or ulcerations noted.  Neck: Cervical lymphadenopathy noted.  Cardiovascular: Normal rate and rhythm. S1,S2 noted.  No murmur, rubs or gallops noted.  Pulmonary/Chest: Normal effort and positive vesicular breath sounds. No respiratory distress. No wheezes, rales or ronchi noted.      Assessment & Plan:   Upper Respiratory Infection:  Get some rest and drink plenty of water Do salt water gargles for the sore throat Can try Flonase for nasal congestion eRx for Amoxil  500 mg TID x 10  Days, preg category B  RTC as needed or if symptoms persist.

## 2015-09-29 NOTE — Patient Instructions (Signed)

## 2015-10-04 ENCOUNTER — Encounter: Payer: Self-pay | Admitting: Internal Medicine

## 2015-10-04 ENCOUNTER — Ambulatory Visit (INDEPENDENT_AMBULATORY_CARE_PROVIDER_SITE_OTHER): Payer: BLUE CROSS/BLUE SHIELD | Admitting: Internal Medicine

## 2015-10-04 VITALS — BP 108/60 | HR 85 | Temp 97.9°F | Wt 131.0 lb

## 2015-10-04 DIAGNOSIS — T50905A Adverse effect of unspecified drugs, medicaments and biological substances, initial encounter: Secondary | ICD-10-CM

## 2015-10-04 DIAGNOSIS — T887XXA Unspecified adverse effect of drug or medicament, initial encounter: Secondary | ICD-10-CM | POA: Diagnosis not present

## 2015-10-04 NOTE — Progress Notes (Signed)
Subjective:    Patient ID: Natalie Kelley, female    DOB: Oct 06, 1981, 34 y.o.   MRN: [id number]  HPI  Pt presents to the clinic today with c/o rash. She noticed this 2 days ago. It is located all over her body. She is on Amoxicillin for an upper respiratory infection but stopped taking it after she noticed the rash. She denies changes in soaps, lotions or detergents. She has tried Benadryl and Zantac without any relief. Her upper respiratory symptoms have improved.  Review of Systems      Past Medical History  Diagnosis Date  . No pertinent past medical history   . Abnormal pap 2009  . Ectopic pregnancy   . Miscarriage within last 12 months     Current Outpatient Prescriptions  Medication Sig Dispense Refill  . Prenatal Vit-Fe Fumarate-FA (PRENATAL FORMULA 3 PO) Take 1 tablet by mouth daily.    . Na Sulfate-K Sulfate-Mg Sulf (SUPREP BOWEL PREP) SOLN Take 1 kit by mouth once. (Patient not taking: Reported on 10/04/2015) 1 Bottle 0   No current facility-administered medications for this visit.    Allergies  Allergen Reactions  . Aspirin Hives  . Sulfonamide Derivatives Hives and Other (See Comments)    fever  . Terbutaline Hives    Skin discoloration and skin peeling    Family History  Problem Relation Age of Onset  . Colon cancer Father   . Esophageal varices Father   . Hypertension Father   . Stroke Father   . Atrial fibrillation Mother   . Autism Son     Social History   Social History  . Marital Status: Married    Spouse Name: N/A  . Number of Children: 2  . Years of Education: N/A   Occupational History  . oncology nurse    Social History Main Topics  . Smoking status: Former Smoker -- 0.50 packs/day for 3 years    Types: Cigarettes  . Smokeless tobacco: Never Used  . Alcohol Use: 0.0 oz/week    0 Standard drinks or equivalent per week     Comment: occasionally  . Drug Use: No  . Sexual Activity: Yes   Other Topics Concern  . Not on file    Social History Narrative     Constitutional: Denies fever, malaise, fatigue, headache or abrupt weight changes.  Respiratory: Denies difficulty breathing, shortness of breath, cough or sputum production.   Cardiovascular: Denies chest pain, chest tightness, palpitations or swelling in the hands or feet.  Skin: Pt reports rash. Denies lesions or ulcercations.   No other specific complaints in a complete review of systems (except as listed in HPI above).  Objective:   Physical Exam   BP 108/60 mmHg  Pulse 85  Temp(Src) 97.9 F (36.6 C) (Oral)  Wt 131 lb (59.421 kg)  SpO2 98%  LMP 03/07/2015 Wt Readings from Last 3 Encounters:  10/04/15 131 lb (59.421 kg)  09/28/15 131 lb (59.421 kg)  06/14/15 128 lb 4 oz (58.174 kg)    General: Appears her stated age, well developed, well nourished in NAD. Skin: Red, sandpapery, maculopapular rash noted on abdomen, chest, arms and back. Rash is blanchable. Cardiovascular: Normal rate and rhythm. S1,S2 noted.  No murmur, rubs or gallops noted. Pulmonary/Chest: Normal effort and positive vesicular breath sounds. No respiratory distress. No wheezes, rales or ronchi noted.   BMET    Component Value Date/Time   NA 136 03/13/2015 1053   K 3.8 03/13/2015 1053   CL  103 03/13/2015 1053   CO2 29 03/13/2015 1053   GLUCOSE 99 03/13/2015 1053   BUN 16 03/13/2015 1053   CREATININE 0.79 03/13/2015 1053   CALCIUM 9.6 03/13/2015 1053   GFRNONAA >90 05/06/2012 1415   GFRAA >90 05/06/2012 1415    Lipid Panel     Component Value Date/Time   CHOL 199 03/13/2015 1053   TRIG 115.0 03/13/2015 1053   HDL 55.10 03/13/2015 1053   CHOLHDL 4 03/13/2015 1053   VLDL 23.0 03/13/2015 1053   LDLCALC 121* 03/13/2015 1053    CBC    Component Value Date/Time   WBC 6.2 03/13/2015 1053   RBC 5.20* 03/13/2015 1053   HGB 12.9 03/13/2015 1053   HCT 39.4 03/13/2015 1053   PLT 324.0 03/13/2015 1053   MCV 75.8* 03/13/2015 1053   MCH 25.4* 10/12/2014 0610    MCHC 32.8 03/13/2015 1053   RDW 14.4 03/13/2015 1053   LYMPHSABS 2.3 03/13/2015 1053   MONOABS 0.4 03/13/2015 1053   EOSABS 0.1 03/13/2015 1053   BASOSABS 0.0 03/13/2015 1053    Hgb A1C No results found for: HGBA1C      Assessment & Plan:   Allergic Reaction to Amoxil:  Stop medication- placed on allergy list She declines 80 mg Depo IM today Advised her to try Clariin 10 mg daily Return precautions given  RTC as needed or if symptoms persist or worsen

## 2015-10-04 NOTE — Patient Instructions (Signed)
Drug Allergy Allergic reactions to medicines are common. Some allergic reactions are mild. A delayed type of drug allergy that occurs 1 week or more after exposure to a medicine or vaccine is called serum sickness. A life-threatening, sudden (acute) allergic reaction that involves the whole body is called anaphylaxis. CAUSES  "True" drug allergies occur when there is an allergic reaction to a medicine. This is caused by overactivity of the immune system. First, the body becomes sensitized. The immune system is triggered by your first exposure to the medicine. Following this first exposure, future exposure to the same medicine may be life-threatening. Almost any medicine can cause an allergic reaction. Common ones are:  Penicillin.  Sulfonamides (sulfa drugs).  Local anesthetics.  X-ray dyes that contain iodine. SYMPTOMS  Common symptoms of a minor allergic reaction are:  Swelling around the mouth.  An itchy red rash or hives.  Vomiting or diarrhea. Anaphylaxis can cause swelling of the mouth and throat. This makes it difficult to breathe and swallow. Severe reactions can be fatal within seconds, even after exposure to only a trace amount of the drug that causes the reaction. HOME CARE INSTRUCTIONS  If you are unsure of what caused your reaction, write down:  The names of the medicines you took.  How much medicine you took.  How you took the medicine, such as whether you took a pill, injected the medicine, or applied it to your skin.  All of the things you ate and drank.  The date and time of your reaction.  The symptoms of the reaction.  You may want to follow up with an allergy specialist after the reaction has cleared in order to be tested to confirm the allergy. It is important to confirm that your reaction is an allergy, not just a side effect to the medicine. If you have a true allergy to a medicine, this may prevent that medicine and related medicines from being given to  you when you are very ill.  If you have hives or a rash:  Take medicines as directed by your caregiver.  You may use an over-the-counter antihistamine (diphenhydramine) as needed.  Apply cold compresses to the skin or take baths in cool water. Avoid hot baths or showers.  If you are severely allergic:  Continuous observation after a severe reaction may be needed. Hospitalization is often required.  Wear a medical alert bracelet or necklace stating your allergy.  You and your family must learn how to use an anaphylaxis kit or give an epinephrine injection to temporarily treat an emergency allergic reaction. If you have had a severe reaction, always carry your epinephrine injection or anaphylaxis kit with you. This can be lifesaving if you have a severe reaction.  Do not drive or perform tasks after treatment until the medicines used to treat your reaction have worn off, or until your caregiver says it is okay.  If you have a drug allergy that was confirmed by your health care provider:  Carry information about the drug allergy with you at all times.  Always check with a pharmacist before taking any over-the-counter medicine. SEEK MEDICAL CARE IF:   You think you had an allergic reaction. Symptoms usually start within 30 minutes after exposure.  Symptoms are getting worse rather than better.  You develop new symptoms.  The symptoms that brought you to your caregiver return. SEEK IMMEDIATE MEDICAL CARE IF:   You have swelling of the mouth, difficulty breathing, or wheezing.  You have a tight  feeling in your chest or throat.  You develop hives, swelling, or itching all over your body.  You develop severe vomiting or diarrhea.  You feel faint or pass out. This is an emergency. Use your epinephrine injection or anaphylaxis kit as you have been instructed. Call for emergency medical help. Even if you improve after the injection, you need to be examined at a hospital emergency  department. MAKE SURE YOU:   Understand these instructions.  Will watch your condition.  Will get help right away if you are not doing well or get worse.   This information is not intended to replace advice given to you by your health care provider. Make sure you discuss any questions you have with your health care provider.   Document Released: 10/07/2005 Document Revised: 10/28/2014 Document Reviewed: 05/09/2015 Elsevier Interactive Patient Education 2016 [employer]  

## 2015-10-04 NOTE — Progress Notes (Signed)
Pre visit review using our clinic review tool, if applicable. No additional management support is needed unless otherwise documented below in the visit note. 

## 2015-11-07 ENCOUNTER — Telehealth: Payer: Self-pay

## 2015-11-07 NOTE — Telephone Encounter (Signed)
Pt left v/m needing lab titers to have immunization record updated at [facility]-; left v/m for pt to cb.

## 2015-11-09 ENCOUNTER — Telehealth: Payer: Self-pay | Admitting: Family Medicine

## 2015-11-09 NOTE — Telephone Encounter (Signed)
Please schedule visit with me - will do lab/imm at that visit  Tell her to bring any paperwork she needs

## 2015-11-09 NOTE — Telephone Encounter (Signed)
error 

## 2015-11-09 NOTE — Telephone Encounter (Signed)
Pt called and needs the quantiferon gold for tb. Hep B, MMR, Tdap, varicella. Please advise.  [phone]

## 2015-11-10 ENCOUNTER — Ambulatory Visit (INDEPENDENT_AMBULATORY_CARE_PROVIDER_SITE_OTHER): Payer: BLUE CROSS/BLUE SHIELD | Admitting: Family Medicine

## 2015-11-10 ENCOUNTER — Encounter: Payer: Self-pay | Admitting: Family Medicine

## 2015-11-10 VITALS — BP 132/72 | HR 104 | Temp 99.0°F | Ht 59.5 in | Wt 137.5 lb

## 2015-11-10 DIAGNOSIS — Z0184 Encounter for antibody response examination: Secondary | ICD-10-CM | POA: Insufficient documentation

## 2015-11-10 NOTE — Progress Notes (Signed)
Pre visit review using our clinic review tool, if applicable. No additional management support is needed unless otherwise documented below in the visit note. 

## 2015-11-10 NOTE — Progress Notes (Signed)
Subjective:    Patient ID: Natalie Kelley, female    DOB: 1981/02/02, 35 y.o.   MRN: [id number]  HPI Here for paperwork for work and also trying to apply for grad school   Had positive MMR titers in 2006  Will need have those re done   Needs the TB gold test   Immune to Hep B 2006 but still has to have it done again   Had her flu shot in 07/18/15  Has not had a varicella vaccine  Had chicken pox as a child - twice  Had an equivocal titer in 2006   Is pregnant  5/18 is her due date    Patient Active Problem List   Diagnosis Date Noted  . Immunity status testing 11/10/2015  . Routine general medical examination at a health care facility 03/13/2015  . Encounter for routine gynecological examination 03/13/2015  . Family history of colon cancer 03/13/2015  . ABNORMAL GLANDULAR PAPANICOLAOU SMEAR OF CERVIX 05/03/2008  . FIBROCYSTIC BREAST DISEASE 02/05/2007   Past Medical History  Diagnosis Date  . No pertinent past medical history   . Abnormal pap 2009  . Ectopic pregnancy   . Miscarriage within last 12 months    Past Surgical History  Procedure Laterality Date  . Cryotherapy  2009  . Dilation and evacuation N/A 10/12/2014    Procedure: DILATATION AND EVACUATION;  Surgeon: Margarette Asal, MD;  Location: [facility];  Service: Gynecology;  Laterality: N/A;   Social History  Substance Use Topics  . Smoking status: Former Smoker -- 0.50 packs/day for 3 years    Types: Cigarettes  . Smokeless tobacco: Never Used  . Alcohol Use: No   Family History  Problem Relation Age of Onset  . Colon cancer Father   . Esophageal varices Father   . Hypertension Father   . Stroke Father   . Atrial fibrillation Mother   . Autism Son    Allergies  Allergen Reactions  . Aspirin Hives  . Sulfonamide Derivatives Hives and Other (See Comments)    fever  . Terbutaline Hives    Skin discoloration and skin peeling   Current Outpatient Prescriptions on File Prior to Visit  Medication  Sig Dispense Refill  . Prenatal Vit-Fe Fumarate-FA (PRENATAL FORMULA 3 PO) Take 1 tablet by mouth daily.     No current facility-administered medications on file prior to visit.    Review of Systems    Review of Systems  Constitutional: Negative for fever, appetite change, fatigue and unexpected weight change.  Eyes: Negative for pain and visual disturbance.  Respiratory: Negative for cough and shortness of breath.   Cardiovascular: Negative for cp or palpitations    Gastrointestinal: Negative for nausea, diarrhea and constipation.  Genitourinary: Negative for urgency and frequency.  Skin: Negative for pallor or rash   Neurological: Negative for weakness, light-headedness, numbness and headaches.  Hematological: Negative for adenopathy. Does not bruise/bleed easily.  Psychiatric/Behavioral: Negative for dysphoric mood. The patient is not nervous/anxious.      Objective:   Physical Exam  Constitutional: She appears well-developed and well-nourished. No distress.  Well appearing pregnant female  Skin: Skin is warm and dry.  Psychiatric: She has a normal mood and affect.          Assessment & Plan:   Problem List Items Addressed This Visit      Other   Immunity status testing - Primary    Rev prev labs  For job/school requires titers for MMR,varicella  and hep B re done Also TB gold   utd flu shot       Relevant Orders   Hepatitis B surface antibody (Completed)   Measles/Mumps/Rubella Immunity (Completed)   Quantiferon tb gold assay (Completed)   Varicella zoster antibody, IgG (Completed)

## 2015-11-10 NOTE — Patient Instructions (Signed)
Labs today  We will get your form filled out when labs return

## 2015-11-11 LAB — QUANTIFERON TB GOLD ASSAY (BLOOD)
INTERFERON GAMMA RELEASE ASSAY: NEGATIVE
Mitogen value: 3.45 IU/mL
Quantiferon Nil Value: 0.05 IU/mL
Quantiferon Tb Ag Minus Nil Value: 0.01 IU/mL
TB Ag value: 0.06 IU/mL

## 2015-11-11 LAB — HEPATITIS B SURFACE ANTIBODY, QUANTITATIVE: HEPATITIS B-POST: 1000 m[IU]/mL

## 2015-11-12 NOTE — Assessment & Plan Note (Signed)
Rev prev labs  For job/school requires titers for MMR,varicella and hep B re done Also TB gold   utd flu shot

## 2015-11-13 LAB — MEASLES/MUMPS/RUBELLA IMMUNITY
Mumps IgG: 86.2 AU/mL — ABNORMAL HIGH (ref <9.00)
RUBEOLA IGG: 15.4 [AU]/ml (ref <25.00)
Rubella: 0.63 Index (ref <0.90)

## 2015-11-13 LAB — VARICELLA ZOSTER ANTIBODY, IGG: Varicella IgG: 112.8 Index (ref <135.00)

## 2015-11-14 ENCOUNTER — Telehealth: Payer: Self-pay | Admitting: Family Medicine

## 2015-11-14 NOTE — Telephone Encounter (Signed)
Left voicemail requesting pt to call office back (SEE LAB RESULTS FOR DR. Royden Purl COMMENTS)

## 2015-11-14 NOTE — Telephone Encounter (Signed)
Addressed on lab results

## 2015-11-14 NOTE — Telephone Encounter (Signed)
Addendum to her lab report- she is pregnant and cannot likely get live vaccines  Copy of lab to pt and her OBGYN please

## 2015-11-27 ENCOUNTER — Inpatient Hospital Stay (HOSPITAL_COMMUNITY)
Admission: AD | Admit: 2015-11-27 | Discharge: 2015-11-30 | DRG: 782 | Disposition: A | Discharge status: Home / Self Care | Payer: BLUE CROSS/BLUE SHIELD | Source: Ambulatory Visit | Attending: Obstetrics & Gynecology | Admitting: Obstetrics & Gynecology

## 2015-11-27 ENCOUNTER — Encounter (HOSPITAL_COMMUNITY): Payer: Self-pay | Admitting: *Deleted

## 2015-11-27 DIAGNOSIS — Z823 Family history of stroke: Secondary | ICD-10-CM

## 2015-11-27 DIAGNOSIS — O4402 Placenta previa specified as without hemorrhage, second trimester: Principal | ICD-10-CM | POA: Diagnosis present

## 2015-11-27 DIAGNOSIS — O4592 Premature separation of placenta, unspecified, second trimester: Secondary | ICD-10-CM | POA: Diagnosis present

## 2015-11-27 DIAGNOSIS — Z87891 Personal history of nicotine dependence: Secondary | ICD-10-CM

## 2015-11-27 DIAGNOSIS — Z3A25 25 weeks gestation of pregnancy: Secondary | ICD-10-CM

## 2015-11-27 DIAGNOSIS — Z8249 Family history of ischemic heart disease and other diseases of the circulatory system: Secondary | ICD-10-CM

## 2015-11-27 DIAGNOSIS — O4400 Placenta previa specified as without hemorrhage, unspecified trimester: Secondary | ICD-10-CM | POA: Diagnosis present

## 2015-11-27 LAB — CBC
HCT: 30.8 % — ABNORMAL LOW (ref 36.0–46.0)
HEMOGLOBIN: 10.3 g/dL — AB (ref 12.0–15.0)
MCH: 24.9 pg — AB (ref 26.0–34.0)
MCHC: 33.4 g/dL (ref 30.0–36.0)
MCV: 74.6 fL — AB (ref 78.0–100.0)
Platelets: 264 10*3/uL (ref 150–400)
RBC: 4.13 MIL/uL (ref 3.87–5.11)
RDW: 14.6 % (ref 11.5–15.5)
WBC: 7.4 10*3/uL (ref 4.0–10.5)

## 2015-11-27 LAB — URINALYSIS, ROUTINE W REFLEX MICROSCOPIC
Bilirubin Urine: NEGATIVE
GLUCOSE, UA: 100 mg/dL — AB
Ketones, ur: NEGATIVE mg/dL
Nitrite: NEGATIVE
Protein, ur: NEGATIVE mg/dL
pH: 5 (ref 5.0–8.0)

## 2015-11-27 LAB — URINE MICROSCOPIC-ADD ON

## 2015-11-27 LAB — TYPE AND SCREEN
ABO/RH(D): O POS
ANTIBODY SCREEN: NEGATIVE

## 2015-11-27 MED ORDER — ZOLPIDEM TARTRATE 5 MG PO TABS: 5.0000 mg | ORAL_TABLET | Freq: Every evening | ORAL | Status: DC | PRN

## 2015-11-27 MED ORDER — SODIUM CHLORIDE 0.9% FLUSH
3.0000 mL | Freq: Two times a day (BID) | INTRAVENOUS | Status: DC
  Administered 2015-11-28 – 2015-11-29 (×4): 3 mL via INTRAVENOUS

## 2015-11-27 MED ORDER — PRENATAL MULTIVITAMIN CH
1.0000 | ORAL_TABLET | Freq: Every day | ORAL | Status: DC
  Administered 2015-11-28 – 2015-11-30 (×3): 1 via ORAL
  Filled 2015-11-27 (×3): qty 1

## 2015-11-27 MED ORDER — BETAMETHASONE SOD PHOS & ACET 6 (3-3) MG/ML IJ SUSP
12.0000 mg | INTRAMUSCULAR | Status: AC
  Administered 2015-11-27 – 2015-11-28 (×2): 12 mg via INTRAMUSCULAR
  Filled 2015-11-27 (×2): qty 2

## 2015-11-27 MED ORDER — SODIUM CHLORIDE 0.9 % IV SOLN: 250.0000 mL | INTRAVENOUS | Status: DC | PRN

## 2015-11-27 MED ORDER — CALCIUM CARBONATE ANTACID 500 MG PO CHEW: 2.0000 | CHEWABLE_TABLET | ORAL | Status: DC | PRN

## 2015-11-27 MED ORDER — SODIUM CHLORIDE 0.9% FLUSH: 3.0000 mL | INTRAVENOUS | Status: DC | PRN

## 2015-11-27 MED ORDER — DOCUSATE SODIUM 100 MG PO CAPS
100.0000 mg | ORAL_CAPSULE | Freq: Every day | ORAL | Status: DC
  Administered 2015-11-28 – 2015-11-30 (×3): 100 mg via ORAL
  Filled 2015-11-27 (×3): qty 1

## 2015-11-27 MED ORDER — ACETAMINOPHEN 325 MG PO TABS: 650.0000 mg | ORAL_TABLET | ORAL | Status: DC | PRN

## 2015-11-27 NOTE — H&P (Signed)
Natalie Kelley is a 35 y.o. female presenting for observation after questionable abruption was found incidentally on ultrasound today in the office.  The patient has known previa and has had no bleeding.  Growth u/s was scheduled for today (1#14, 78%) and 32x7 mm pocket of blood was seen superior to the cervix; AFI 20.  The patient does have h/o PTL x 2.    Maternal Medical History:  Fetal activity: Perceived fetal activity is normal.   Last perceived fetal movement was within the past hour.    Prenatal complications: Placental abnormality.   Prenatal Complications - Diabetes: none.    OB History    Gravida Para Term Preterm AB TAB SAB Ectopic Multiple Living   Past Medical History  Diagnosis Date  . No pertinent past medical history   . Abnormal pap 2009  . Ectopic pregnancy   . Miscarriage within last 12 months    Past Surgical History  Procedure Laterality Date  . Cryotherapy  2009  . Dilation and evacuation N/A 10/12/2014    Procedure: DILATATION AND EVACUATION;  Surgeon: Meriel Pica, MD;  Location: [facility];  Service: Gynecology;  Laterality: N/A;   Family History: family history includes Atrial fibrillation in her mother; Autism in her son; Colon cancer in her father; Esophageal varices in her father; Hypertension in her father; Stroke in her father. Social History:  reports that she has quit smoking. Her smoking use included Cigarettes. She has a 1.5 pack-year smoking history. She has never used smokeless tobacco. She reports that she does not drink alcohol or use illicit drugs.   Prenatal Transfer Tool  Maternal Diabetes: No Genetic Screening: Normal Maternal Ultrasounds/Referrals: Normal Fetal Ultrasounds or other Referrals:  None Maternal Substance Abuse:  No Significant Maternal Medications:  None Significant Maternal Lab Results:  None Other Comments:  None  ROS    Blood pressure 120/67, pulse 104, temperature 98 F (36.7 C),  temperature source Oral, resp. rate 20, weight 63.504 kg (140 lb), last menstrual period 06/01/2015, SpO2 99 %. Maternal Exam:  Abdomen: Patient reports no abdominal tenderness. Fundal height is c/w dates.   Estimated fetal weight is 1#14.       Physical Exam  Constitutional: She is oriented to person, place, and time. She appears well-developed and well-nourished.  GI: Soft. There is no rebound and no guarding.  Neurological: She is alert and oriented to person, place, and time.  Skin: Skin is warm and dry.  Psychiatric: She has a normal mood and affect. Her behavior is normal.    Prenatal labs: ABO, Rh:   Antibody:   Rubella: 0.63 (01/20 1449) RPR:    HBsAg:    HIV:    GBS:     Assessment/Plan: 34yo [id number] at [redacted]w[redacted]d with small abruption; previa -Admit for BMZ -NST q shift -IV; saline lock   Baker Kogler 11/27/2015, 6:39 PM

## 2015-11-27 NOTE — MAU Note (Signed)
Patient was seen in md office today and had an US done that showed "a pocket of fluid."  Pt was told to come as direct admit to women's.  No c/o bleeding and no pain. Reports +fetal movement.

## 2015-11-28 NOTE — Progress Notes (Signed)
25 5/7 weeks  No bleeding  VSS Afeb Uterus soft, NT  FHT no decels UCs none  A/P: Central placenta previa with small abruption on U/S         BMTZ # 2 tonight

## 2015-11-29 DIAGNOSIS — O4592 Premature separation of placenta, unspecified, second trimester: Secondary | ICD-10-CM | POA: Diagnosis present

## 2015-11-29 DIAGNOSIS — O4402 Placenta previa specified as without hemorrhage, second trimester: Secondary | ICD-10-CM | POA: Diagnosis present

## 2015-11-29 DIAGNOSIS — Z87891 Personal history of nicotine dependence: Secondary | ICD-10-CM | POA: Diagnosis not present

## 2015-11-29 DIAGNOSIS — Z823 Family history of stroke: Secondary | ICD-10-CM | POA: Diagnosis not present

## 2015-11-29 DIAGNOSIS — Z3A25 25 weeks gestation of pregnancy: Secondary | ICD-10-CM | POA: Diagnosis not present

## 2015-11-29 DIAGNOSIS — Z8249 Family history of ischemic heart disease and other diseases of the circulatory system: Secondary | ICD-10-CM | POA: Diagnosis not present

## 2015-11-29 NOTE — Progress Notes (Signed)
Patient ID: Natalie Kelley, female   DOB: 1981-02-25, 35 y.o.   MRN: [id number] Pt without complaints GFM  No bleeding or ctxs  VSSAF FHR 140s no decels or ctxs  Abd Gravid nt Neg homans bil  IUP at 25 6/7 Complete previa with central abruption on [place] (asymtomatic) Got last BMZ late last night.  Will monitor for 24 hours and d/c home if stable DL

## 2015-11-30 NOTE — Discharge Instructions (Signed)
Placental Abruption Your placenta is the organ that nourishes your unborn baby (fetus). Your baby gets his or her blood supply and nutrients through your placenta. It is your baby's life support system. It is attached to the inside of your uterus until after your baby is born.  Placental abruption is when the placenta partly or completely separates from the uterus before your baby is born. This is rare, but it can happen any time after 20 weeks of pregnancy. A small separation may not cause problems, but a large separation may be dangerous for you and your baby. CAUSES  Most of the time the cause of a placental abruption is unknown. Though it is rare, a placental abruption can be caused by:   An abdominal injury.   The baby turning from a buttocks-first position (breech presentation) or a sideways position (transverse) to a headfirst position (cephalic).   Delivering the first of multiple babies (twins, triplets, or more).   Sudden loss of amniotic fluid (premature rupture of the membranes).   An abnormally short umbilical cord. RISK FACTORS Some risk factors make a placental abruption more likely, including:  History of placental abruption.  High blood pressure (hypertension).  Smoking.  Alcohol intake.  Blood clotting problems.  Too much amniotic fluid.  Having had multiples (twins or triplets or more).  Seizures and convulsions.  Diabetes mellitus.  Having had more than four children.  Age 34 years or older.  Illegal drug use.  Injury to your abdomen. SIGNS AND SYMPTOMS  A small placental abruption may not cause symptoms. If you do have symptoms, they may include:  Mild abdominal pain.  Slight vaginal bleeding. Symptoms of severe placental abruption depend on the size of the separation and the stage of pregnancy. Symptoms may include:   Sudden pain in your uterus.  Abdominal pain.  Vaginal bleeding.  Tender uterus.  Severe abdominal pain with  tenderness.  Continual contractions of your uterus.  Back pain.  Weakness, light-headedness. DIAGNOSIS  Placental abruption is suspected when a pregnant woman develops sudden pain in her uterus. The health care provider will check whether the uterus is very tender, hard, and enlarging and whether the baby has an abnormal heart rate or rhythm. Ultrasonography (commonly called an ultrasound) will be done. Blood work will also be done to make sure that there are enough healthy red blood cells and that there are no clotting problems or signs of too much blood loss. TREATMENT  Placental abruption is usually an emergency. It requires treatment right away. Your treatment will depend on:   The amount of bleeding.  Whether you or you baby are in distress.  The stage of your pregnancy.  The maturity of the baby. Treatment for partial separation of the placenta is bed rest and close observation. You also may need a blood transfusion or to receive fluids through an IV tube. Treatment for complete placental separation is delivery of your baby. You may have a cesarean delivery if your baby is in distress. HOME CARE INSTRUCTIONS   Only take medicines as directed by your health care provider.  Arrange for help at home before and after you deliver the baby, especially if you had a cesarean delivery or lost a lot of blood.  Get plenty of rest and sleep.  Do not have sexual intercourse until your health care provider says it is okay.  Do not use tampons or douche unless your health care provider says it is okay. SEEK MEDICAL CARE IF:  You  have light vaginal bleeding or spotting.  You have any type of trauma, such as a fall or jolt during an accident.  You are having trouble avoiding drugs, alcohol, or smoking. SEEK IMMEDIATE MEDICAL CARE IF:  You have vaginal bleeding.  You have abdominal pain.  You have continuous uterine contractions.  You have a hard, tender uterus.  You do not feel  the baby move, or the baby moves very little. MAKE SURE YOU:  Understand these instructions.  Will watch your condition.  Will get help right away if you are not doing well or get worse.   This information is not intended to replace advice given to you by your health care provider. Make sure you discuss any questions you have with your health care provider.   Document Released: 10/07/2005 Document Revised: 10/12/2013 Document Reviewed: 07/30/2013 Elsevier Interactive Patient Education 2016 [employer] Placenta Previa Placenta previa is a condition in pregnant women where the placenta implants in the lower part of the uterus. The placenta either partially or completely covers the opening to the cervix. This is a problem because the baby must pass through the cervix during delivery. There are three types of placenta previa. They include:   Marginal placenta previa. The placenta is near the cervix, but does not cover the opening.  Partial placenta previa. The placenta covers part of the cervical opening.  Complete placenta previa. The placenta covers the entire cervical opening.  Depending on the type of placenta previa, there is a chance the placenta may move into a normal position and no longer cover the cervix as the pregnancy progresses. It is important to keep all prenatal visits with your caregiver.  RISK FACTORS You may be more likely to develop placenta previa if you:   Are carrying more than one baby (multiples).   Have an abnormally shaped uterus.   Have scars on the lining of the uterus.   Had previous surgeries involving the uterus, such as a cesarean delivery.   Have delivered a baby previously.   Have a history of placenta previa.   Have smoked or used cocaine during pregnancy.   Are age 34 or older during pregnancy.  SYMPTOMS The main symptom of placenta previa is sudden, painless vaginal bleeding during the second half of pregnancy. The amount of  bleeding can be light to very heavy. The bleeding may stop on its own, but almost always returns. Cramping, regular contractions, abdominal pain, and lower back pain can also occur with placenta previa.  DIAGNOSIS Placenta previa can be diagnosed through an ultrasound by finding where the placenta is located. The ultrasound may find placenta previa either during a routine prenatal visit or after vaginal bleeding is noticed. If you are diagnosed with placenta previa, your caregiver may avoid vaginal exams to reduce the risk of heavy bleeding. There is a chance that placenta previa may not be diagnosed until bleeding occurs during labor.  TREATMENT Specific treatment depends on:   How much you are bleeding or if the bleeding has stopped.  How far along you are in your pregnancy.   The condition of the baby.   The location of the baby and placenta.   The type of placenta previa.  Depending on the factors above, your caregiver may recommend:   Decreased activity.   Bed rest at home or in the hospital.  Pelvic rest. This means no sex, using tampons, douching, pelvic exams, or placing anything into the vagina.  A blood transfusion to replace  maternal blood loss.  A cesarean delivery if the bleeding is heavy and cannot be controlled or the placenta completely covers the cervix.  Medication to stop premature labor or mature the fetal lungs if delivery is needed before the pregnancy is full term.  WHEN SHOULD YOU SEEK IMMEDIATE MEDICAL CARE IF YOU ARE SENT HOME WITH PLACENTA PREVIA? Seek immediate medical care if you show any symptoms of placenta previa. You will need to go to the hospital to get checked immediately. Again, those symptoms are:  Sudden, painless vaginal bleeding, even a small amount.  Cramping or regular contractions.  Lower back or abdominal pain.   This information is not intended to replace advice given to you by your health care provider. Make sure you discuss  any questions you have with your health care provider.   Document Released: 10/07/2005 Document Revised: 10/28/2014 Document Reviewed: 01/08/2013 Elsevier Interactive Patient Education [employer]

## 2015-11-30 NOTE — Discharge Summary (Signed)
ADMISSION DIAGNOSIS: IUIP at 25 5/7 Central placenta previa Central abruption  Discharge Diagnosis: Same  Hospital Course:  35 year old female with central previa admitted yesterday with clot 3 cm noted on exam.  Clot superior to cervix.  She was admitted and given betamethasone series.  Patient has not had any vaginal bleeding since 20 weeks.  BP 87/59 mmHg  Pulse 89  Temp(Src) 98.2 F (36.8 C) (Oral)  Resp 20  Ht 4' 11.5" (1.511 m)  Wt 137 lb 11.2 oz (62.46 kg)  BMI 27.36 kg/m2  SpO2 95%  LMP 06/01/2015 No results found for this or any previous visit (from the past 24 hour(s)). Abdomen is soft and non tender  Patient will be discharged home on bedrest. Given very strict bleeding precautions.  She will follow up in office next week for office visit and ultrasound.

## 2015-12-20 NOTE — Telephone Encounter (Signed)
Pt saw Dr Milinda Antis on 11/10/15.

## 2016-01-01 ENCOUNTER — Inpatient Hospital Stay (HOSPITAL_COMMUNITY): Payer: BLUE CROSS/BLUE SHIELD

## 2016-01-01 ENCOUNTER — Encounter (HOSPITAL_COMMUNITY): Payer: Self-pay

## 2016-01-01 ENCOUNTER — Inpatient Hospital Stay (HOSPITAL_COMMUNITY)
Admission: AD | Admit: 2016-01-01 | Discharge: 2016-01-27 | DRG: 765 | Disposition: A | Discharge status: Home / Self Care | Payer: BLUE CROSS/BLUE SHIELD | Source: Ambulatory Visit | Attending: Obstetrics and Gynecology | Admitting: Obstetrics and Gynecology

## 2016-01-01 DIAGNOSIS — Z823 Family history of stroke: Secondary | ICD-10-CM | POA: Diagnosis not present

## 2016-01-01 DIAGNOSIS — K219 Gastro-esophageal reflux disease without esophagitis: Secondary | ICD-10-CM | POA: Diagnosis present

## 2016-01-01 DIAGNOSIS — Z3A34 34 weeks gestation of pregnancy: Secondary | ICD-10-CM | POA: Diagnosis not present

## 2016-01-01 DIAGNOSIS — Z8249 Family history of ischemic heart disease and other diseases of the circulatory system: Secondary | ICD-10-CM

## 2016-01-01 DIAGNOSIS — Z1389 Encounter for screening for other disorder: Secondary | ICD-10-CM

## 2016-01-01 DIAGNOSIS — O43813 Placental infarction, third trimester: Secondary | ICD-10-CM | POA: Diagnosis not present

## 2016-01-01 DIAGNOSIS — O4413 Placenta previa with hemorrhage, third trimester: Secondary | ICD-10-CM

## 2016-01-01 DIAGNOSIS — R918 Other nonspecific abnormal finding of lung field: Secondary | ICD-10-CM | POA: Diagnosis not present

## 2016-01-01 DIAGNOSIS — Z87891 Personal history of nicotine dependence: Secondary | ICD-10-CM

## 2016-01-01 DIAGNOSIS — O4403 Placenta previa specified as without hemorrhage, third trimester: Secondary | ICD-10-CM

## 2016-01-01 DIAGNOSIS — Z3A30 30 weeks gestation of pregnancy: Secondary | ICD-10-CM

## 2016-01-01 DIAGNOSIS — O4400 Placenta previa specified as without hemorrhage, unspecified trimester: Secondary | ICD-10-CM | POA: Diagnosis present

## 2016-01-01 DIAGNOSIS — Z452 Encounter for adjustment and management of vascular access device: Secondary | ICD-10-CM | POA: Diagnosis not present

## 2016-01-01 DIAGNOSIS — Z3A33 33 weeks gestation of pregnancy: Secondary | ICD-10-CM | POA: Diagnosis not present

## 2016-01-01 DIAGNOSIS — O09213 Supervision of pregnancy with history of pre-term labor, third trimester: Secondary | ICD-10-CM

## 2016-01-01 DIAGNOSIS — O4693 Antepartum hemorrhage, unspecified, third trimester: Secondary | ICD-10-CM

## 2016-01-01 DIAGNOSIS — O9962 Diseases of the digestive system complicating childbirth: Secondary | ICD-10-CM | POA: Diagnosis present

## 2016-01-01 DIAGNOSIS — D582 Other hemoglobinopathies: Secondary | ICD-10-CM | POA: Diagnosis not present

## 2016-01-01 DIAGNOSIS — Z23 Encounter for immunization: Secondary | ICD-10-CM | POA: Diagnosis not present

## 2016-01-01 DIAGNOSIS — O09893 Supervision of other high risk pregnancies, third trimester: Secondary | ICD-10-CM

## 2016-01-01 LAB — CBC
HEMATOCRIT: 31.8 % — AB (ref 36.0–46.0)
HEMOGLOBIN: 10.8 g/dL — AB (ref 12.0–15.0)
MCH: 24.9 pg — ABNORMAL LOW (ref 26.0–34.0)
MCHC: 34 g/dL (ref 30.0–36.0)
MCV: 73.3 fL — AB (ref 78.0–100.0)
Platelets: 250 10*3/uL (ref 150–400)
RBC: 4.34 MIL/uL (ref 3.87–5.11)
RDW: 13.8 % (ref 11.5–15.5)
WBC: 10.1 10*3/uL (ref 4.0–10.5)

## 2016-01-01 LAB — TYPE AND SCREEN
ABO/RH(D): O POS
Antibody Screen: NEGATIVE

## 2016-01-01 MED ORDER — ZOLPIDEM TARTRATE 5 MG PO TABS: 5.0000 mg | ORAL_TABLET | Freq: Every evening | ORAL | Status: DC | PRN

## 2016-01-01 MED ORDER — DOCUSATE SODIUM 100 MG PO CAPS
100.0000 mg | ORAL_CAPSULE | Freq: Every day | ORAL | Status: DC
  Administered 2016-01-01 – 2016-01-22 (×19): 100 mg via ORAL
  Filled 2016-01-01 (×21): qty 1

## 2016-01-01 MED ORDER — NIFEDIPINE 10 MG PO CAPS
10.0000 mg | ORAL_CAPSULE | Freq: Four times a day (QID) | ORAL | Status: DC
  Administered 2016-01-01 – 2016-01-02 (×4): 10 mg via ORAL
  Filled 2016-01-01 (×4): qty 1

## 2016-01-01 MED ORDER — SODIUM CHLORIDE 0.9% FLUSH
3.0000 mL | Freq: Two times a day (BID) | INTRAVENOUS | Status: DC
Start: 2016-01-01 — End: 2016-01-23
  Administered 2016-01-01 – 2016-01-22 (×35): 3 mL via INTRAVENOUS

## 2016-01-01 MED ORDER — PRENATAL MULTIVITAMIN CH
1.0000 | ORAL_TABLET | Freq: Every day | ORAL | Status: DC
  Administered 2016-01-02 – 2016-01-22 (×21): 1 via ORAL
  Filled 2016-01-01 (×21): qty 1

## 2016-01-01 MED ORDER — CALCIUM CARBONATE ANTACID 500 MG PO CHEW: 2.0000 | CHEWABLE_TABLET | ORAL | Status: DC | PRN

## 2016-01-01 MED ORDER — SODIUM CHLORIDE 0.9% FLUSH
3.0000 mL | INTRAVENOUS | Status: DC | PRN
  Administered 2016-01-06 – 2016-01-17 (×2): 3 mL via INTRAVENOUS
  Filled 2016-01-01 (×2): qty 3

## 2016-01-01 MED ORDER — ACETAMINOPHEN 325 MG PO TABS: 650.0000 mg | ORAL_TABLET | ORAL | Status: DC | PRN

## 2016-01-01 NOTE — H&P (Addendum)
Natalie Kelley is a 35 y.o. female G5P2 @ 30+4 presenting for vaginal bleeding.  Pt with h/o previa and questionable abruption early in this pregnancy. admitted 11/2015 for bleeding and received BMZ series at that time.  Pt awoke @ 3am today with large amt of red bleeding in toilet, brown discharge x1 since then.  Denies ctx or vb.    Pt on monitor now having mild ctx Q 2-4 min.  Not feeling them.  Unconfirmed ultrasound report shows no previa and no abruption.  Pt with h/o PTL x 2, PTD x 1  History OB History    Gravida Para Term Preterm AB TAB SAB Ectopic Multiple Living   5 2 1 1 2  1 1  2      Past Medical History  Diagnosis Date  . No pertinent past medical history   . Abnormal pap 2009  . Ectopic pregnancy   . Miscarriage within last 12 months    Past Surgical History  Procedure Laterality Date  . Cryotherapy  2009  . Dilation and evacuation N/A 10/12/2014    Procedure: DILATATION AND EVACUATION;  Surgeon: Meriel Picaichard M Holland, MD;  Location: [facility];  Service: Gynecology;  Laterality: N/A;   Family History: family history includes Atrial fibrillation in her mother; Autism in her son; Colon cancer in her father; Esophageal varices in her father; Hypertension in her father; Stroke in her father. Social History:  reports that she has quit smoking. Her smoking use included Cigarettes. She has a 1.5 pack-year smoking history. She has never used smokeless tobacco. She reports that she does not drink alcohol or use illicit drugs.   Prenatal Transfer Tool  Maternal Diabetes: No Genetic Screening: Declined Maternal Ultrasounds/Referrals: normal Fetal Ultrasounds or other Referrals:  None Maternal Substance Abuse:  No Significant Maternal Medications:  None Significant Maternal Lab Results:  None Other Comments:  None  ROS    Blood pressure 111/67, pulse 114, temperature 98.3 F (36.8 C), temperature source Oral, resp. rate 16, height 4' 11.5" (1.511 m), weight 141 lb (63.957 kg), last  menstrual period 06/01/2015. Exam Physical Exam  Gen - NAD Abd - gravid, NT Ext - No edema PV - deferred Prenatal labs: ABO, Rh: --/--/O POS (03/13 1610) Antibody: NEG (03/13 1610) Rubella: 0.63 (01/20 1449) RPR:    HBsAg:    HIV:    GBS:     Assessment/Plan: Admit CBC Ultrasound Procardia    Jancarlo Biermann 01/01/2016, 7:36 PM

## 2016-01-02 ENCOUNTER — Inpatient Hospital Stay (HOSPITAL_COMMUNITY): Payer: BLUE CROSS/BLUE SHIELD

## 2016-01-02 MED ORDER — MAGNESIUM SULFATE 50 % IJ SOLN
2.0000 g/h | INTRAVENOUS | Status: DC
  Filled 2016-01-02: qty 80

## 2016-01-02 MED ORDER — BETAMETHASONE SOD PHOS & ACET 6 (3-3) MG/ML IJ SUSP
12.0000 mg | INTRAMUSCULAR | Status: AC
  Administered 2016-01-02 – 2016-01-03 (×2): 12 mg via INTRAMUSCULAR
  Filled 2016-01-02 (×2): qty 2

## 2016-01-02 MED ORDER — LACTATED RINGERS IV SOLN
INTRAVENOUS | Status: DC
  Administered 2016-01-02 – 2016-01-23 (×9): via INTRAVENOUS

## 2016-01-02 MED ORDER — MAGNESIUM SULFATE BOLUS VIA INFUSION
4.0000 g | Freq: Once | INTRAVENOUS | Status: AC
  Administered 2016-01-02: 4 g via INTRAVENOUS
  Filled 2016-01-02: qty 500

## 2016-01-02 MED ORDER — MAGNESIUM SULFATE 4 GM/100ML IV SOLN
4.0000 g | Freq: Once | INTRAVENOUS | Status: DC
  Filled 2016-01-02: qty 100

## 2016-01-02 MED ORDER — MAGNESIUM SULFATE 50 % IJ SOLN
2.0000 g/h | INTRAVENOUS | Status: DC
  Administered 2016-01-02: 2 g/h via INTRAVENOUS
  Filled 2016-01-02: qty 80

## 2016-01-02 NOTE — Progress Notes (Signed)
Patient ID: Natalie MilesLila Kelley, female   DOB: 1981/10/10, 35 y.o.   MRN: [id number] Pt with mild ctxs Brown bleeding minimal GFM  VSSAF FHR cat 1 140s Ctxs occas  Abd Gravid nt  IUP at 30 5/7 Admitted with Vaginal bleeding and history of Placenta previa Prelim U/S from last night did Not show previa or abruption I reviewed US and discussed with MFM, they and I rec repeat US with vag probe to accurately eval edge of placenta Cont with EFM S/P BMZ series DL

## 2016-01-02 NOTE — Progress Notes (Addendum)
Patient ID: Natalie Kelley, female   DOB: [birth year], 35 y.o.   MRN: [id number] The US today by Dr Otho PerlNitsche and Dr Sherrie Georgeecker confirm VASA PREVIA Previously thought placenta previa They recommend in house until delivery If any fetal status change, fresh vaginal bleeding, then deliver immediately. If continues with brown bleeding or ctxs, then deliver at 32 weeks If improves and no ctxs or bleeding, then C/S at 34 weeks Will repeat BMZ now.  Continuous EFM, and IV access, Mag 12 hours for CP prophylaxis DL

## 2016-01-03 ENCOUNTER — Encounter (HOSPITAL_COMMUNITY): Payer: Self-pay | Admitting: Anesthesiology

## 2016-01-03 MED ORDER — CITRIC ACID-SODIUM CITRATE 334-500 MG/5ML PO SOLN
ORAL | Status: DC
Start: 2016-01-03 — End: 2016-01-03
  Filled 2016-01-03: qty 15

## 2016-01-03 NOTE — Progress Notes (Signed)
30 6/7 weeks  Some tightening off and on, not consistent. No bleeding  Filed Vitals:   01/03/16 0842 01/03/16 0843  BP:  104/65  Pulse:  108  Temp: 98.4 F (36.9 C)   Resp: 18    Uterus soft, NT  FHT  +accels, perhaps one or two brief variable decels-did not trace well, not repetitive UCs q3-7 min, mild  A/P: Vasa Previa @ 30 6/7 weeks         S/P BMTZ         Magnesium sulfate infusion finished this am         Will continue IV fluids         T&S done         D/W patient potential for severe bleeding for baby or her and potential stat cesarean section with general anesthesia, D/W possible transfusion with risk of HIV/Hep.         She states she understands and agrees         Will deliver for any BRB, regular firm UCs or change in fetal status         Deliver at 32 weeks if still having brown spotting

## 2016-01-03 NOTE — Anesthesia Preprocedure Evaluation (Deleted)
Anesthesia Evaluation  Patient identified by MRN, date of birth, ID band Patient awake    Reviewed: Allergy & Precautions, NPO status , Patient's Chart, lab work & pertinent test results  History of Anesthesia Complications Negative for: history of anesthetic complications  Airway Mallampati: III  TM Distance: >3 FB Neck ROM: Full    Dental no notable dental hx. (+) Dental Advisory Given   Pulmonary former smoker,    Pulmonary exam normal breath sounds clear to auscultation       Cardiovascular negative cardio ROS Normal cardiovascular exam Rhythm:Regular Rate:Normal     Neuro/Psych negative neurological ROS  negative psych ROS   GI/Hepatic negative GI ROS, Neg liver ROS,   Endo/Other  negative endocrine ROS  Renal/GU negative Renal ROS  negative genitourinary   Musculoskeletal negative musculoskeletal ROS (+)   Abdominal   Peds negative pediatric ROS (+)  Hematology negative hematology ROS (+)   Anesthesia Other Findings   Reproductive/Obstetrics negative OB ROS (+) Pregnancy Hx of vasa previa at 30 weeks, no prior C/S                             Anesthesia Physical Anesthesia Plan  ASA: II  Anesthesia Plan: Spinal and General   Post-op Pain Management:    Induction: Intravenous  Airway Management Planned:   Additional Equipment:   Intra-op Plan:   Post-operative Plan: Extubation in OR  Informed Consent: I have reviewed the patients History and Physical, chart, labs and discussed the procedure including the risks, benefits and alternatives for the proposed anesthesia with the patient or authorized representative who has indicated his/her understanding and acceptance.   Dental advisory given  Plan Discussed with: CRNA  Anesthesia Plan Comments: (Seen as a consult secondary to being admitted with a vasa previa. Plan per patient is to proceed with a C/S at 34 weeks but  always the potential need for stat C/S and bleeding. Discussed with patient that in an emergency, general anesthesia is often required. She reports that with her two prior vaginal deliveries her epidural "never worked". I discussed that if she did make it for an elective C/S we would perform a spinal anesthetic with general anesthesia as our backup. All questions and concerns addressed.)        Anesthesia Quick Evaluation

## 2016-01-03 NOTE — Progress Notes (Signed)
Patient states she feels some tightening from time to time but does not feel all of her contractions. She states that they are not painful. Contractions every 2 to 6 minutes irregularly. Adjusted monitor at approx. [id number]. Prior to that it appeared baby was having some variables but it is possible that was maternal tracing at monitor was in patients groin and she was sitting straight up in the bed talking on the phone.

## 2016-01-03 NOTE — Progress Notes (Signed)
D/W anesthesiology this am-they are aware of patient

## 2016-01-04 MED ORDER — SALINE SPRAY 0.65 % NA SOLN
1.0000 | NASAL | Status: DC | PRN
  Administered 2016-01-04: 1 via NASAL
  Filled 2016-01-04: qty 44

## 2016-01-04 NOTE — Progress Notes (Addendum)
No current c/o.  No bleeding/spotting with wiping today.  Rare CTX.  Active FM.    VSS.  FHT Cat 1 Rare CTX on monitor  Gen: A&O x 3 Abd: soft, NT Ext: no c/c/e  34yo [id number] at 6667w0d with vasa previa -Continuous fetal monitoring -NICU consult placed -Saline lock IVF -s/p BMZ and magnesium sulfate for neuroppx -Plan per MFM for C/S at 32 weeks if continued light spotting, 34 weeks if stable, and emergently for fresh bleed, labor, or fetal indication -Patient wants BTL. Pt to discuss with husband if she will want BTL in case of emergent delivery  Mitchel HonourMegan Lariza Cothron, DO

## 2016-01-04 NOTE — Consult Note (Signed)
Asked by Dr. Langston MaskerMorris to provide prenatal consultation for patient at risk for preterm delivery due to vasa previa, was admitted 3/13 after episode of bleeding.  Mother is 35 y.o. G5 P1-1-1-2 who is now [redacted] weeks EGA, previous Dx of placenta previa and she was admitted Feb 6 after office US showed possible abruption.  Was treated with BMZ at that time and received a booster course during this admission (3/14 and 3/15). Monitor showed UCs and she was given Procardia and started on MgSO4.  Membranes intact, no signs of infection.  Discussed usual expectations for preterm infant at 31+ weeks gestation, including possible needs for DR resuscitation, respiratory support, IV access, antibiotics.  Projected possible length of stay in NICU until 36 - [redacted] wks EGA, discussed discharge criteria.  She plans to breast feed and will pump postnatally.  Also discussed benefits of parental presence and kangaroo care.  Patient was attentive, had appropriate questions, and was appreciative of my input.  Thank you for the consulting Neonatology.  Total time 25 minutes  Natalie Kelley, Jr., MD Neonatologist

## 2016-01-04 NOTE — Progress Notes (Signed)
I received a phone call from the charge nurse letting me know that the patient has demanded AMA papers and will be leaving.  I called immediately into the patient's room and spoke with her directly.  She was upset because the neonatologist was in her room for consult and her husband was asked to leave because her young children were in the hospital under his care against hospital rules.  She requested 5 minutes to allow for both she and her husband to speak with the neonatologist and no exception was granted.  Her husband works during the day and takes care of their two children at night and his free time is limited.  In speaking with the patient, I informed her that to leave AMA would place her life as well has her unborn child's life in jeopardy.  She has decided to stay in the hospital and continue receiving care.   Mitchel HonourMegan Surabhi Gadea, DO

## 2016-01-05 NOTE — Progress Notes (Signed)
EFM tracing sketchy @ times secondary pt sitting up eating dinner.

## 2016-01-05 NOTE — Progress Notes (Signed)
Patient was made aware of the hospital's flu restrictions by security when her husband brought their 34 year old son with him "to drop off a few items". Patient requested a few minutes for husband to be included in neonatal consult but was told the child needed to be taken out of the hospital or at least to the parking lot with her mother-in-law. Patient became very upset, loud, and verbally aggressive to security and the nursing supervisor. At approximately 2057 patient took herself off of fetal monitor and requested AMA papers. Dr. Langston MaskerMorris was called and informed about the situation. Dr. Langston MaskerMorris called the patient's room. Patient then decided to stay and continue with plan of care. Fetal heart monitor was reapplied. Will continue to monitor.

## 2016-01-05 NOTE — Progress Notes (Signed)
2761w1d  S// no further spotting, only rare mild cyx  O//BP 117/69 mmHg  Pulse 100  Temp(Src) 98.6 F (37 C) (Oral)  Resp 16  Ht 4' 11.5" (1.511 m)  Wt 143 lb 3.2 oz (64.955 kg)  BMI 28.45 kg/m2  LMP 06/01/2015  FHR stable Hemoglobin & Hematocrit     Component Value Date/Time   HGB 10.8* 01/01/2016 1610   HCT 31.8* 01/01/2016 1610   A+P// 5861w1d, vasa previa>> for CS @ 34

## 2016-01-06 ENCOUNTER — Telehealth (HOSPITAL_COMMUNITY): Payer: Self-pay | Admitting: *Deleted

## 2016-01-06 LAB — TYPE AND SCREEN
ABO/RH(D): O POS
Antibody Screen: NEGATIVE

## 2016-01-06 NOTE — Progress Notes (Signed)
7137w2d  S// rare ctx, no spotting  O/  BP 112/62 mmHg  Pulse 92  Temp(Src) 98.1 F (36.7 C) (Oral)  Resp 18  Ht 4' 11.5" (1.511 m)  Wt 143 lb 3.2 oz (64.955 kg)  BMI 28.45 kg/m2  LMP 06/01/2015  FHR stable  A+P// 1337w2d , vasa previa>>for CS @ 34, cont obsv

## 2016-01-07 NOTE — Progress Notes (Signed)
3853w3d  S// resting, no changes  O// BP 111/72 mmHg  Pulse 102  Temp(Src) 98.6 F (37 C) (Oral)  Resp 18  Ht 4' 11.5" (1.511 m)  Wt 143 lb 3.2 oz (64.955 kg)  BMI 28.45 kg/m2  LMP 06/01/2015  FHR stable  A+P//4553w3d , vasa previa, stable

## 2016-01-08 NOTE — Progress Notes (Signed)
Initial Nutrition Assessment  DOCUMENTATION CODES:   Not applicable  INTERVENTION:  Regular Diet May order double protein portions, snacks TID and from retail  NUTRITION DIAGNOSIS:   Increased nutrient needs related to  (pregnancy and fetal growth requirements) as evidenced by  (31 weeks IUP).  GOAL:  Patient will meet greater than or equal to 90% of their needs MONITOR:  Weight trends  REASON FOR ASSESSMENT:  Antenatal   ASSESSMENT:   31 4/7 weeks, placenta previa. 11 lb weight gain.pre-pregnancy BMI 26.3  Appetite reported by pt as good, tolerated well  Diet Order:  Diet regular Room service appropriate?: Yes; Fluid consistency:: Thin  Skin:  Reviewed, no issues  Height:   Ht Readings from Last 1 Encounters:  01/01/16 4' 11.5" (1.511 m)   Weight:   Wt Readings from Last 1 Encounters:  01/03/16 143 lb 3.2 oz (64.955 kg)   BMI:  Body mass index is 28.45 kg/(m^2).  Estimated Nutritional Needs:   Kcal:  1700-1900  Protein:  75-85 g  Fluid:  2 L  EDUCATION NEEDS: no education needs identified at this time    Natalie PilgrimKatherine Zoey Kelley M.Odis LusterEd. R.D. LDN Neonatal Nutrition Support Specialist/RD III Pager [phone]      Phone [phone]

## 2016-01-08 NOTE — Progress Notes (Signed)
31 4/7 weeks  No bleeding, few UCs  VSS Afeb  FHR Cat one  A/P: Vasa Previa         C/S @ 34 weeks

## 2016-01-09 NOTE — Progress Notes (Signed)
No current c/o. No bleeding/spotting with wiping today. Rare CTX. Active FM.   VSS.  FHT Cat 1  Gen: A&O x 3 Abd: soft, NT Ext: no c/c/e  34yo [id number] at 4026w5d with vasa previa -s/p BMZ and magnesium sulfate for neuroppx -Plan C/S at 34 weeks  Mitchel HonourMegan Lillianah Swartzentruber, DO

## 2016-01-10 NOTE — Progress Notes (Signed)
6643w6d  S// no changes  O//BP 123/79 mmHg  Pulse 115  Temp(Src) 98.2 F (36.8 C) (Oral)  Resp 17  Ht 4' 11.5" (1.511 m)  Wt 143 lb 3.2 oz (64.955 kg)  BMI 28.45 kg/m2  LMP 06/01/2015  Stable FHR  A+P//6943w6d, vasa previa, for primary CS 4/4

## 2016-01-10 NOTE — Progress Notes (Signed)
Sitting up on the side of the bed, her friend doing her manicure and pedicure

## 2016-01-11 LAB — PREPARE RBC (CROSSMATCH)

## 2016-01-11 NOTE — Progress Notes (Signed)
No complaints.  Denies pain and VB.  + FM  AF, VSS + FHT  A/P:  32 weeks, vasa previa Continue inpatient management, c/section scheduled 4/4

## 2016-01-12 LAB — TYPE AND SCREEN
ABO/RH(D): O POS
ABO/RH(D): O POS
ANTIBODY SCREEN: NEGATIVE
Antibody Screen: NEGATIVE
UNIT DIVISION: 0
UNIT DIVISION: 0

## 2016-01-12 NOTE — Progress Notes (Signed)
Stable, no bleeding  VSS Afeb FHT Cat one UCs irregular and mild IV fluids started for UCs Uterus soft, NT  A/P: Continue clear liquids and IV fluids         BR with BRP         D/W patient

## 2016-01-12 NOTE — Progress Notes (Signed)
32 1/7 weeks Had a single spot of BRB on tissue last pm Slight brown D/C this am Had some low abdominal discomfort last pm-none today  VSS Afeb FHT cat one UCs irreg and mild  A/P: Vasa Previa         Maintain saline lock and T&S         If any further BRB or increasing D/C or pain-will proceed to delivery

## 2016-01-13 MED ORDER — NIFEDIPINE 10 MG PO CAPS
10.0000 mg | ORAL_CAPSULE | Freq: Four times a day (QID) | ORAL | Status: DC
  Administered 2016-01-13 – 2016-01-23 (×40): 10 mg via ORAL
  Filled 2016-01-13 (×40): qty 1

## 2016-01-13 NOTE — Progress Notes (Signed)
32 2/7 weeks  No BRB, no pain  VSS Afeb Uterus soft, NT  FHT cat one  A/P: Vasa Previa         IV fluids, regular diet

## 2016-01-14 NOTE — Progress Notes (Signed)
32 3/7 wks  No bleeding, no pain  VSS Afeb FHT cat one UCs 3-5/hr, mild  A/P: Vasa Previa         Continue Nifedipine         IV to saline lock

## 2016-01-15 LAB — TYPE AND SCREEN
ABO/RH(D): O POS
ANTIBODY SCREEN: NEGATIVE

## 2016-01-15 NOTE — Progress Notes (Signed)
No current c/o.  Small dark blood with void this am. Patient eating breakfast.  Active FM.   VSS.  FHT Cat 1  Gen: A&O x 3 Abd: soft, NT Ext: no c/c/e  34yo [id number] at 6770w4d with vasa previa -s/p BMZ and magnesium sulfate for neuroppx -Plan C/S at 34 weeks.  Spoke with RN and if patient has anymore blood, will plan C/S.  Will reassess before lunch.    Mitchel HonourMegan Tamela Elsayed, DO

## 2016-01-15 NOTE — Progress Notes (Signed)
Dr. Langston MaskerMorris informed of ctx pattern, MD told pt no longer bleeding, pain level is 2 on 0-10 scale.  MD states to continue to observe & notify her if any changes.

## 2016-01-16 NOTE — Progress Notes (Signed)
9016w5d  S// no further ctx or spotting  O// BP 97/60 mmHg  Pulse 99  Temp(Src) 97.8 F (36.6 C) (Oral)  Resp 18  Ht 4' 11.5" (1.511 m)  Wt 140 lb 8 oz (63.73 kg)  BMI 27.91 kg/m2  SpO2 99%  LMP 06/01/2015  Stable FHR  A+P//7716w5d, vasa previa, CS in 1 week

## 2016-01-17 NOTE — Progress Notes (Signed)
Problem List:  1. Vasa PREVIA  S:  Patient is resting. Denies Bright red bleeding. Reports good fetal movement.  O:  BP 114/68 mmHg  Pulse 92  Temp(Src) 98 F (36.7 C) (Axillary)  Resp 16  Ht 4' 11.5" (1.511 m)  Wt 63.73 kg (140 lb 8 oz)  BMI 27.91 kg/m2  SpO2 99%  LMP 06/01/2015 Abdomen is soft and non tender  IMPRESSION: IUP at 32 w 6 days  VASA PREVIA  PLAN: C section if any bleeding occurs otherwise plan C Section next Tuesday

## 2016-01-18 LAB — TYPE AND SCREEN
ABO/RH(D): O POS
Antibody Screen: NEGATIVE

## 2016-01-18 NOTE — Progress Notes (Signed)
Patient ID: Natalie MilesLila Kelley, female   DOB: July 17, 1981, 35 y.o.   MRN: [id number] Pt without complaints Denies bleeding, ctxs GFM  VSSAF FHR 140s Cat 1 Ctxs rare  Abd Gravid, nt  IMPRESSION: IUP at 33 0/7 days  VASA PREVIA  PLAN: C section if any bleeding occurs otherwise plan C Section next Tuesday

## 2016-01-19 NOTE — Progress Notes (Signed)
No complaints,  No VB or pain  AF, VSS Gen - NAD Abd - gravid, NT Ext - NT  A/P:  Vasa previa C-section scheduled 4/4

## 2016-01-20 NOTE — Progress Notes (Signed)
No complaints, No VB or pain.  Denies contractions  AF, VSS Gen - NAD Abd - gravid, NT  A/P: Vasa previa C-section scheduled 4/4

## 2016-01-21 LAB — TYPE AND SCREEN
ABO/RH(D): O POS
Antibody Screen: NEGATIVE

## 2016-01-21 NOTE — Progress Notes (Signed)
No complaints, No VB or pain. Denies contractions + FHT AF, VSS Gen - NAD Abd - gravid, NT  A/P: Vasa previa C-section scheduled 4/4

## 2016-01-22 MED ORDER — GENTAMICIN SULFATE 40 MG/ML IJ SOLN
INTRAVENOUS | Status: DC
  Filled 2016-01-22: qty 8.25

## 2016-01-22 NOTE — Progress Notes (Signed)
No current c/o. No vaginal bleeding since last week. Active FM.   VSS.  FHT Cat 1  Gen: A&O x 3 Abd: soft, NT Ext: no c/c/e  34yo [id number] at 5334w4d with vasa previa -s/p BMZ x 2 courses and magnesium sulfate for neuroppx -C/S scheduled for tomorrow with Dr. Marcelle OverlieHolland.   Mitchel HonourMegan Haaris Metallo, DO

## 2016-01-23 ENCOUNTER — Encounter (HOSPITAL_COMMUNITY): Payer: Self-pay | Admitting: Anesthesiology

## 2016-01-23 ENCOUNTER — Encounter (HOSPITAL_COMMUNITY)
Admission: AD | Disposition: A | Discharge status: Home / Self Care | Payer: Self-pay | Source: Ambulatory Visit | Attending: Obstetrics and Gynecology

## 2016-01-23 ENCOUNTER — Inpatient Hospital Stay (HOSPITAL_COMMUNITY): Payer: BLUE CROSS/BLUE SHIELD | Admitting: Anesthesiology

## 2016-01-23 LAB — CBC
HEMATOCRIT: 31.9 % — AB (ref 36.0–46.0)
HEMOGLOBIN: 10.9 g/dL — AB (ref 12.0–15.0)
MCH: 24.7 pg — ABNORMAL LOW (ref 26.0–34.0)
MCHC: 34.2 g/dL (ref 30.0–36.0)
MCV: 72.3 fL — AB (ref 78.0–100.0)
Platelets: 253 10*3/uL (ref 150–400)
RBC: 4.41 MIL/uL (ref 3.87–5.11)
RDW: 14.1 % (ref 11.5–15.5)
WBC: 9.3 10*3/uL (ref 4.0–10.5)

## 2016-01-23 SURGERY — Surgical Case
Anesthesia: Spinal

## 2016-01-23 MED ORDER — NALOXONE HCL 2 MG/2ML IJ SOSY
1.0000 ug/kg/h | PREFILLED_SYRINGE | INTRAMUSCULAR | Status: DC | PRN
  Filled 2016-01-23: qty 2

## 2016-01-23 MED ORDER — SCOPOLAMINE 1 MG/3DAYS TD PT72
MEDICATED_PATCH | TRANSDERMAL | Status: AC
  Filled 2016-01-23: qty 1

## 2016-01-23 MED ORDER — ONDANSETRON HCL 4 MG/2ML IJ SOLN
INTRAMUSCULAR | Status: AC
  Filled 2016-01-23: qty 2

## 2016-01-23 MED ORDER — FLEET ENEMA 7-19 GM/118ML RE ENEM: 1.0000 | ENEMA | Freq: Every day | RECTAL | Status: DC | PRN

## 2016-01-23 MED ORDER — LACTATED RINGERS IV SOLN: 2.5000 [IU]/h | INTRAVENOUS | Status: AC

## 2016-01-23 MED ORDER — DIPHENHYDRAMINE HCL 50 MG/ML IJ SOLN: 12.5000 mg | INTRAMUSCULAR | Status: DC | PRN

## 2016-01-23 MED ORDER — NALBUPHINE HCL 10 MG/ML IJ SOLN: 5.0000 mg | Freq: Once | INTRAMUSCULAR | Status: DC | PRN

## 2016-01-23 MED ORDER — FENTANYL CITRATE (PF) 100 MCG/2ML IJ SOLN
INTRAMUSCULAR | Status: AC
  Filled 2016-01-23: qty 2

## 2016-01-23 MED ORDER — MEASLES, MUMPS & RUBELLA VAC ~~LOC~~ INJ
0.5000 mL | INJECTION | Freq: Once | SUBCUTANEOUS | Status: DC
  Filled 2016-01-23: qty 0.5

## 2016-01-23 MED ORDER — GENTAMICIN SULFATE 40 MG/ML IJ SOLN: INTRAVENOUS | Status: DC

## 2016-01-23 MED ORDER — OXYCODONE-ACETAMINOPHEN 5-325 MG PO TABS
2.0000 | ORAL_TABLET | ORAL | Status: DC | PRN
  Administered 2016-01-24: 1 via ORAL

## 2016-01-23 MED ORDER — ACETAMINOPHEN 325 MG PO TABS
650.0000 mg | ORAL_TABLET | ORAL | Status: DC | PRN
  Administered 2016-01-25: 650 mg via ORAL
  Filled 2016-01-23: qty 2

## 2016-01-23 MED ORDER — LACTATED RINGERS IV SOLN
INTRAVENOUS | Status: DC
  Administered 2016-01-23: 07:00:00 via INTRAVENOUS

## 2016-01-23 MED ORDER — FENTANYL CITRATE (PF) 100 MCG/2ML IJ SOLN
25.0000 ug | INTRAMUSCULAR | Status: DC | PRN
  Administered 2016-01-23 (×2): 25 ug via INTRAVENOUS

## 2016-01-23 MED ORDER — LANOLIN HYDROUS EX OINT: 1.0000 "application " | TOPICAL_OINTMENT | CUTANEOUS | Status: DC | PRN

## 2016-01-23 MED ORDER — NALBUPHINE HCL 10 MG/ML IJ SOLN: 5.0000 mg | INTRAMUSCULAR | Status: DC | PRN

## 2016-01-23 MED ORDER — DIBUCAINE 1 % RE OINT
1.0000 "application " | TOPICAL_OINTMENT | RECTAL | Status: DC | PRN
  Filled 2016-01-23: qty 28

## 2016-01-23 MED ORDER — SIMETHICONE 80 MG PO CHEW
80.0000 mg | CHEWABLE_TABLET | ORAL | Status: DC | PRN
  Filled 2016-01-23: qty 1

## 2016-01-23 MED ORDER — CITRIC ACID-SODIUM CITRATE 334-500 MG/5ML PO SOLN
ORAL | Status: AC
  Administered 2016-01-23: 30 mL
  Filled 2016-01-23: qty 15

## 2016-01-23 MED ORDER — TETANUS-DIPHTH-ACELL PERTUSSIS 5-2.5-18.5 LF-MCG/0.5 IM SUSP
0.5000 mL | Freq: Once | INTRAMUSCULAR | Status: DC
  Filled 2016-01-23: qty 0.5

## 2016-01-23 MED ORDER — ZOLPIDEM TARTRATE 5 MG PO TABS: 5.0000 mg | ORAL_TABLET | Freq: Every evening | ORAL | Status: DC | PRN

## 2016-01-23 MED ORDER — PHENYLEPHRINE 8 MG IN D5W 100 ML (0.08MG/ML) PREMIX OPTIME
INJECTION | INTRAVENOUS | Status: DC | PRN
  Administered 2016-01-23: 30 ug/min via INTRAVENOUS

## 2016-01-23 MED ORDER — OXYTOCIN 10 UNIT/ML IJ SOLN
INTRAMUSCULAR | Status: AC
  Filled 2016-01-23: qty 4

## 2016-01-23 MED ORDER — SENNOSIDES-DOCUSATE SODIUM 8.6-50 MG PO TABS
2.0000 | ORAL_TABLET | ORAL | Status: DC
  Administered 2016-01-23 – 2016-01-27 (×4): 2 via ORAL
  Filled 2016-01-23 (×6): qty 2

## 2016-01-23 MED ORDER — SIMETHICONE 80 MG PO CHEW
80.0000 mg | CHEWABLE_TABLET | Freq: Three times a day (TID) | ORAL | Status: DC
  Administered 2016-01-23 – 2016-01-27 (×10): 80 mg via ORAL
  Filled 2016-01-23 (×14): qty 1

## 2016-01-23 MED ORDER — DIPHENHYDRAMINE HCL 25 MG PO CAPS
25.0000 mg | ORAL_CAPSULE | Freq: Four times a day (QID) | ORAL | Status: DC | PRN
  Filled 2016-01-23: qty 1

## 2016-01-23 MED ORDER — BISACODYL 10 MG RE SUPP
10.0000 mg | Freq: Every day | RECTAL | Status: DC | PRN
  Filled 2016-01-23: qty 1

## 2016-01-23 MED ORDER — IBUPROFEN 800 MG PO TABS
800.0000 mg | ORAL_TABLET | Freq: Three times a day (TID) | ORAL | Status: DC | PRN
  Administered 2016-01-23 – 2016-01-27 (×8): 800 mg via ORAL
  Filled 2016-01-23 (×8): qty 1

## 2016-01-23 MED ORDER — OXYCODONE-ACETAMINOPHEN 5-325 MG PO TABS
1.0000 | ORAL_TABLET | ORAL | Status: DC | PRN
  Administered 2016-01-24 – 2016-01-27 (×4): 1 via ORAL
  Filled 2016-01-23 (×5): qty 1

## 2016-01-23 MED ORDER — MORPHINE SULFATE (PF) 0.5 MG/ML IJ SOLN
INTRAMUSCULAR | Status: AC
  Filled 2016-01-23: qty 10

## 2016-01-23 MED ORDER — DEXAMETHASONE SODIUM PHOSPHATE 4 MG/ML IJ SOLN
INTRAMUSCULAR | Status: DC | PRN
  Administered 2016-01-23: 4 mg via INTRAVENOUS

## 2016-01-23 MED ORDER — MEPERIDINE HCL 25 MG/ML IJ SOLN: 6.2500 mg | INTRAMUSCULAR | Status: DC | PRN

## 2016-01-23 MED ORDER — GENTAMICIN SULFATE 40 MG/ML IJ SOLN
INTRAVENOUS | Status: AC
  Administered 2016-01-23: 114.25 mL via INTRAVENOUS
  Filled 2016-01-23: qty 8

## 2016-01-23 MED ORDER — MENTHOL 3 MG MT LOZG
1.0000 | LOZENGE | OROMUCOSAL | Status: DC | PRN
  Filled 2016-01-23: qty 9

## 2016-01-23 MED ORDER — SODIUM CHLORIDE 0.9 % IV SOLN: 250.0000 mL | INTRAVENOUS | Status: DC

## 2016-01-23 MED ORDER — BUPIVACAINE IN DEXTROSE 0.75-8.25 % IT SOLN
INTRATHECAL | Status: DC | PRN
  Administered 2016-01-23: 1.3 mL via INTRATHECAL

## 2016-01-23 MED ORDER — SODIUM CHLORIDE 0.9% FLUSH: 3.0000 mL | INTRAVENOUS | Status: DC | PRN

## 2016-01-23 MED ORDER — DIPHENHYDRAMINE HCL 25 MG PO CAPS
25.0000 mg | ORAL_CAPSULE | ORAL | Status: DC | PRN
  Filled 2016-01-23: qty 1

## 2016-01-23 MED ORDER — ONDANSETRON HCL 4 MG/2ML IJ SOLN
INTRAMUSCULAR | Status: DC | PRN
  Administered 2016-01-23: 4 mg via INTRAVENOUS

## 2016-01-23 MED ORDER — SODIUM CHLORIDE 0.9% FLUSH
3.0000 mL | Freq: Two times a day (BID) | INTRAVENOUS | Status: DC
  Administered 2016-01-24: 3 mL via INTRAVENOUS

## 2016-01-23 MED ORDER — DEXAMETHASONE SODIUM PHOSPHATE 4 MG/ML IJ SOLN
INTRAMUSCULAR | Status: AC
  Filled 2016-01-23: qty 1

## 2016-01-23 MED ORDER — METOCLOPRAMIDE HCL 5 MG/ML IJ SOLN
10.0000 mg | Freq: Once | INTRAMUSCULAR | Status: AC
  Administered 2016-01-23: 10 mg via INTRAVENOUS
  Filled 2016-01-23: qty 2

## 2016-01-23 MED ORDER — PRENATAL MULTIVITAMIN CH
1.0000 | ORAL_TABLET | Freq: Every day | ORAL | Status: DC
  Administered 2016-01-24 – 2016-01-26 (×3): 1 via ORAL
  Filled 2016-01-23 (×4): qty 1

## 2016-01-23 MED ORDER — WITCH HAZEL-GLYCERIN EX PADS: 1.0000 "application " | MEDICATED_PAD | CUTANEOUS | Status: DC | PRN

## 2016-01-23 MED ORDER — FENTANYL CITRATE (PF) 100 MCG/2ML IJ SOLN
INTRAMUSCULAR | Status: DC | PRN
  Administered 2016-01-23: 20 ug via INTRATHECAL

## 2016-01-23 MED ORDER — SCOPOLAMINE 1 MG/3DAYS TD PT72
MEDICATED_PATCH | TRANSDERMAL | Status: DC | PRN
  Administered 2016-01-23: 1 via TRANSDERMAL

## 2016-01-23 MED ORDER — PHENYLEPHRINE 8 MG IN D5W 100 ML (0.08MG/ML) PREMIX OPTIME
INJECTION | INTRAVENOUS | Status: AC
  Filled 2016-01-23: qty 100

## 2016-01-23 MED ORDER — ONDANSETRON HCL 4 MG/2ML IJ SOLN
4.0000 mg | Freq: Three times a day (TID) | INTRAMUSCULAR | Status: DC | PRN
  Administered 2016-01-23: 4 mg via INTRAVENOUS

## 2016-01-23 MED ORDER — SIMETHICONE 80 MG PO CHEW
80.0000 mg | CHEWABLE_TABLET | ORAL | Status: DC
  Administered 2016-01-23 – 2016-01-27 (×4): 80 mg via ORAL
  Filled 2016-01-23 (×6): qty 1

## 2016-01-23 MED ORDER — NALOXONE HCL 0.4 MG/ML IJ SOLN: 0.4000 mg | INTRAMUSCULAR | Status: DC | PRN

## 2016-01-23 MED ORDER — OXYTOCIN 10 UNIT/ML IJ SOLN
40.0000 [IU] | INTRAVENOUS | Status: DC | PRN
  Administered 2016-01-23: 40 [IU] via INTRAVENOUS

## 2016-01-23 MED ORDER — MORPHINE SULFATE (PF) 0.5 MG/ML IJ SOLN
INTRAMUSCULAR | Status: DC | PRN
  Administered 2016-01-23: .2 mg via INTRATHECAL

## 2016-01-23 SURGICAL SUPPLY — 29 items
APL SKNCLS STERI-STRIP NONHPOA (GAUZE/BANDAGES/DRESSINGS) ×1
BENZOIN TINCTURE PRP APPL 2/3 (GAUZE/BANDAGES/DRESSINGS) ×1 IMPLANT
CHLORAPREP W/TINT 26ML (MISCELLANEOUS) ×2 IMPLANT
CLAMP CORD UMBIL (MISCELLANEOUS) IMPLANT
CLOSURE STERI STRIP 1/2 X4 (GAUZE/BANDAGES/DRESSINGS) ×1 IMPLANT
CLOTH BEACON ORANGE TIMEOUT ST (SAFETY) ×2 IMPLANT
DRSG OPSITE POSTOP 4X10 (GAUZE/BANDAGES/DRESSINGS) ×2 IMPLANT
ELECT REM PT RETURN 9FT ADLT (ELECTROSURGICAL) ×2
ELECTRODE REM PT RTRN 9FT ADLT (ELECTROSURGICAL) ×1 IMPLANT
EXTRACTOR VACUUM M CUP 4 TUBE (SUCTIONS) IMPLANT
GLOVE BIO SURGEON STRL SZ7 (GLOVE) ×2 IMPLANT
GLOVE BIOGEL PI IND STRL 7.0 (GLOVE) ×2 IMPLANT
GLOVE BIOGEL PI INDICATOR 7.0 (GLOVE) ×2
GOWN STRL REUS W/TWL LRG LVL3 (GOWN DISPOSABLE) ×4 IMPLANT
KIT ABG SYR 3ML LUER SLIP (SYRINGE) IMPLANT
NDL HYPO 25X5/8 SAFETYGLIDE (NEEDLE) ×1 IMPLANT
NEEDLE HYPO 25X5/8 SAFETYGLIDE (NEEDLE) ×2 IMPLANT
NS IRRIG 1000ML POUR BTL (IV SOLUTION) ×2 IMPLANT
PACK C SECTION WH (CUSTOM PROCEDURE TRAY) ×2 IMPLANT
PAD OB MATERNITY 4.3X12.25 (PERSONAL CARE ITEMS) ×2 IMPLANT
PENCIL SMOKE EVAC W/HOLSTER (ELECTROSURGICAL) ×2 IMPLANT
STRIP CLOSURE SKIN 1/2X4 (GAUZE/BANDAGES/DRESSINGS) ×2 IMPLANT
SUT CHROMIC 0 CTX 36 (SUTURE) ×6 IMPLANT
SUT MON AB 4-0 PS1 27 (SUTURE) ×2 IMPLANT
SUT PDS AB 0 CT1 27 (SUTURE) ×5 IMPLANT
SUT VIC AB 3-0 CT1 27 (SUTURE) ×4
SUT VIC AB 3-0 CT1 TAPERPNT 27 (SUTURE) ×2 IMPLANT
TOWEL OR 17X24 6PK STRL BLUE (TOWEL DISPOSABLE) ×2 IMPLANT
TRAY FOLEY CATH SILVER 14FR (SET/KITS/TRAYS/PACK) ×2 IMPLANT

## 2016-01-23 NOTE — Anesthesia Preprocedure Evaluation (Addendum)
Anesthesia Evaluation  Patient identified by MRN, date of birth, ID band Patient awake    Reviewed: Allergy & Precautions, NPO status , Patient's Chart, lab work & pertinent test results  Airway Mallampati: III  TM Distance: >3 FB Neck ROM: Full    Dental no notable dental hx. (+) Teeth Intact   Pulmonary former smoker,    Pulmonary exam normal breath sounds clear to auscultation       Cardiovascular negative cardio ROS Normal cardiovascular exam Rhythm:Regular Rate:Normal     Neuro/Psych negative neurological ROS  negative psych ROS   GI/Hepatic Neg liver ROS, GERD  Medicated and Controlled,  Endo/Other  negative endocrine ROS  Renal/GU negative Renal ROS  negative genitourinary   Musculoskeletal negative musculoskeletal ROS (+)   Abdominal   Peds  Hematology  (+) anemia ,   Anesthesia Other Findings   Reproductive/Obstetrics (+) Pregnancy Complete Placenta previa Vasa previa Velamentous insertion of umbilical cord 33 5/7 weeks                            Anesthesia Physical Anesthesia Plan  ASA: II  Anesthesia Plan: Spinal   Post-op Pain Management:    Induction:   Airway Management Planned: Natural Airway  Additional Equipment:   Intra-op Plan:   Post-operative Plan:   Informed Consent: I have reviewed the patients History and Physical, chart, labs and discussed the procedure including the risks, benefits and alternatives for the proposed anesthesia with the patient or authorized representative who has indicated his/her understanding and acceptance.     Plan Discussed with: CRNA, Anesthesiologist and Surgeon  Anesthesia Plan Comments:         Anesthesia Quick Evaluation

## 2016-01-23 NOTE — Addendum Note (Signed)
Addendum  created 01/23/16 1928 by Armanda Heritageharlesetta M Mariaclara Spear, CRNA   Modules edited: Clinical Notes   Clinical Notes:  File: [id number]

## 2016-01-23 NOTE — Anesthesia Postprocedure Evaluation (Signed)
Anesthesia Post Note  Patient: Natalie MilesLila Kelley  Procedure(s) Performed: Procedure(s) (LRB): CESAREAN SECTION (N/A)  Patient location during evaluation: PACU Anesthesia Type: Spinal Level of consciousness: awake and alert and oriented Pain management: pain level controlled Vital Signs Assessment: post-procedure vital signs reviewed and stable Respiratory status: spontaneous breathing, nonlabored ventilation and respiratory function stable Cardiovascular status: blood pressure returned to baseline and stable Postop Assessment: no headache, no backache, spinal receding, patient able to bend at knees and no signs of nausea or vomiting Anesthetic complications: no    Last Vitals:  Filed Vitals:   01/23/16 1545 01/23/16 1555  BP:  107/70  Pulse:  79  Temp:  36.5 C  Resp: 15 16    Last Pain:  Filed Vitals:   01/23/16 1555  PainSc: 3                  Natalie Hoff A.

## 2016-01-23 NOTE — Transfer of Care (Signed)
Immediate Anesthesia Transfer of Care Note  Patient: Natalie MilesLila Oliva  Procedure(s) Performed: Procedure(s) with comments: CESAREAN SECTION (N/A) - primary edc 03/07/16   Patient Location: PACU  Anesthesia Type:Spinal  Level of Consciousness: awake, alert  and oriented  Airway & Oxygen Therapy: Patient Spontanous Breathing  Post-op Assessment: Report given to RN and Post -op Vital signs reviewed and stable  Post vital signs: Reviewed and stable  Last Vitals:  Filed Vitals:   01/23/16 0912 01/23/16 1143  BP: 112/74 109/66  Pulse: 105 96  Temp: 36.7 C   Resp: 18 18    Complications: No apparent anesthesia complications

## 2016-01-23 NOTE — Anesthesia Procedure Notes (Signed)
Spinal Patient location during procedure: OR Start time: 01/23/2016 1:37 PM Staffing Anesthesiologist: Mal AmabileFOSTER, Rieley Hausman Performed by: anesthesiologist  Preanesthetic Checklist Completed: patient identified, site marked, surgical consent, pre-op evaluation, timeout performed, IV checked, risks and benefits discussed and monitors and equipment checked Spinal Block Patient position: sitting Prep: site prepped and draped and DuraPrep Patient monitoring: heart rate, cardiac monitor, continuous pulse ox and blood pressure Approach: midline Location: L3-4 Injection technique: single-shot Needle Needle type: Pencan  Needle gauge: 24 G Needle length: 9 cm Needle insertion depth: 4.5 cm Assessment Sensory level: T3 Additional Notes Patient tolerated procedure well. Adequate sensory level.

## 2016-01-23 NOTE — Progress Notes (Signed)
The patient was re-examined with no change in status 

## 2016-01-23 NOTE — Op Note (Signed)
Preoperative diagnosis: 34 week IUP, vasa previa  Postoperative diagnosis: Same  Procedure: Primary low transverse cesarean section  Surgeon: Marcelle OverlieHolland  Anesthesia: Spinal  EBL: 800 cc  Procedure and findings:  Patient has been in-house management for almost one month after initial bleed that resolved, she received betamethasone bed rest and observation for what was felt to be a vasa previa with recommendations for delivery at 34 weeks. After arrival in the operating room and spinal, she was prepped and draped appropriate timeouts were taken and Foley catheter position. Transverse incision made 2 finger breaths above the symphysis carried down to the fascia which was incised and extended transversely. Rectus muscle divided in the midline, peritoneum entered sparely without incident and extended in a vertical fashion. The vesicouterine serosa was incised and the bladder was bluntly and sharply dissected below, bladder blade was repositioned. Transverse incision made in the lower segment extended with blunt dissection, a portion placenta was encountered on the way in a followed by a clear fluid. The fetus was delivered delivered in good condition the infant was suctioned cord clamp passed the pediatric team for further care cord pH and cord blood specimens were sent the placenta was then delivered manually intact, sent to pathology. Uterus exteriorized, cavity wiped clean with laparotomy pack closure obtained the first layer of 0 chromic in a locked fashion followed by an imbricating layer of 0 chromic. This was hemostatic bilateral tubes and ovaries were normal the bladder flap area was intact and hemostatic. Prior to closure sponge, needle, history counts reported as correct 2 peritoneum then closed with a 3-0 Vicryl running suture as was the rectus muscles in the midline 0 PDS transversely for the fascia subcutaneous tissue was hemostatic and was fairly thin 4-0 Monocryl subcuticular closure with good  skin reapproximation clear urine noted at the end of the case she tolerated this well went to recovery room in good condition.  Dictated with [facility]  Yomara Toothman Milana ObeyM Alberto Pina M.D.

## 2016-01-23 NOTE — Anesthesia Postprocedure Evaluation (Signed)
Anesthesia Post Note  Patient: Natalie Kelley  Procedure(s) Performed: Procedure(s) (LRB): CESAREAN SECTION (N/A)  Patient location during evaluation: [facility] Anesthesia Type: Spinal Level of consciousness: patient remains intubated per anesthesia plan and awake and alert Pain management: pain level controlled Vital Signs Assessment: post-procedure vital signs reviewed and stable Respiratory status: spontaneous breathing Cardiovascular status: blood pressure returned to baseline Postop Assessment: no headache, spinal receding, adequate PO intake, patient able to bend at knees, no signs of nausea or vomiting and no backache Anesthetic complications: no    Last Vitals:  Filed Vitals:   01/23/16 1555 01/23/16 1708  BP: 107/70 107/70  Pulse: 79 86  Temp: 36.5 C 36.8 C  Resp: 16 20    Last Pain:  Filed Vitals:   01/23/16 1709  PainSc: 3                  Natalie Kelley, Natalie Kelley

## 2016-01-23 NOTE — Progress Notes (Signed)
S:  Patient is ready for C Section today  O:  BP 128/76 mmHg  Pulse 108  Temp(Src) 98 F (36.7 C) (Oral)  Resp 18  Ht 4' 11.5" (1.511 m)  Wt 63.368 kg (139 lb 11.2 oz)  BMI 27.75 kg/m2  SpO2 95%  LMP 06/01/2015 Abdomen is soft and non tender  IMPRESSION: IUP at 33 w 5 days Vasa Previa  PLAN: C Section for today

## 2016-01-23 NOTE — Lactation Note (Signed)
This note was copied from a baby's chart. Lactation Consultation Note  Patient Name: Natalie Kelley Today's Date: 01/23/2016 Reason for consult: Initial assessment   With this mom of a NICU baby, 33 5/7 weeks and 4 hours old. Mom had a vasa previa, and has been on bedrest for 1 month . Mom is familiar with pumping, and has a DEP at home. I started mom pumping in the initiation setting, and reviewed hand expression with her. Mom was able to express 2 large drops of colostrum, which I brought to the baby. NICU booklet and lactation services reviewed with mom. Mom knows to call for questions/concerns, and to pump every 3 hours, but to rest tonight.    Maternal Data Formula Feeding for Exclusion: Yes (baby in NICU) Has patient been taught Hand Expression?: Yes Does the patient have breastfeeding experience prior to this delivery?: Yes  Feeding    LATCH Score/Interventions                      Lactation Tools Discussed/Used Pump Review: Setup, frequency, and cleaning;Milk Storage;Other (comment) (hand expression, NICU booklet, initiation ssetting) Initiated by:: Skylarr Liz Sabra HeckLee, Rn, IBCLC Date initiated:: 01/23/16   Consult Status Consult Status: Follow-up Date: 01/24/16 Follow-up type: In-patient    Alfred LevinsLee, Javontae Marlette Anne 01/23/2016, 6:05 PM

## 2016-01-24 ENCOUNTER — Encounter (HOSPITAL_COMMUNITY): Payer: Self-pay | Admitting: Obstetrics and Gynecology

## 2016-01-24 LAB — CBC
HEMATOCRIT: 25.5 % — AB (ref 36.0–46.0)
Hemoglobin: 8.7 g/dL — ABNORMAL LOW (ref 12.0–15.0)
MCH: 24.5 pg — ABNORMAL LOW (ref 26.0–34.0)
MCHC: 34.1 g/dL (ref 30.0–36.0)
MCV: 71.8 fL — ABNORMAL LOW (ref 78.0–100.0)
Platelets: 209 10*3/uL (ref 150–400)
RBC: 3.55 MIL/uL — ABNORMAL LOW (ref 3.87–5.11)
RDW: 14 % (ref 11.5–15.5)
WBC: 11.5 10*3/uL — AB (ref 4.0–10.5)

## 2016-01-24 LAB — RPR: RPR: NONREACTIVE

## 2016-01-24 NOTE — Lactation Note (Signed)
This note was copied from a baby's chart. Lactation Consultation Note  Patient Name: Natalie Kelley XBJYN'WToday's Date: 01/24/2016 Reason for consult: Follow-up assessment;Infant < 6lbs;NICU baby   Follow up with Exp BF mom of 20 hour old NICU Infant. Infant is on room air per mom and take a bottle of colostrum/formula last night. Mom reports she is pumping every 3 hours and not seeing colostrum in pump, she is getting gtts of colostrum with hand expression and taking to NICU. Mom demonstrated hand expression this morning and 1 large gtt colostrum noted from each breast. Enc her to pump every 2-3 hours for 15 minutes on Initiate setting followed by hand expression. Mom has a pump at home for use and is aware there are pumps in NICU to use while visiting infant. Mom with no questions. Follow up as needed.   Maternal Data Formula Feeding for Exclusion: No Has patient been taught Hand Expression?: Yes Does the patient have breastfeeding experience prior to this delivery?: Yes  Feeding Feeding Type: Formula Length of feed: 30 min  LATCH Score/Interventions                      Lactation Tools Discussed/Used Pump Review: Setup, frequency, and cleaning   Consult Status Consult Status: Follow-up Date: 01/25/16 Follow-up type: In-patient    Natalie Kelley 01/24/2016, 10:00 AM

## 2016-01-24 NOTE — Progress Notes (Signed)
Subjective: Postpartum Day 1: Cesarean Delivery Patient reports tolerating PO, + flatus and no problems voiding.  Baby stable in NICU,  on room air  Objective: Vital signs in last 24 hours: Temp:  [97.3 F (36.3 C)-98.9 F (37.2 C)] 98.4 F (36.9 C) (04/05 0526) Pulse Rate:  [67-96] 69 (04/05 0526) Resp:  [14-26] 15 (04/05 0526) BP: (91-116)/(47-79) 98/51 mmHg (04/05 0526) SpO2:  [93 %-100 %] 95 % (04/05 0526)  Physical Exam:  General: alert and cooperative Lochia: appropriate Uterine Fundus: firm Incision: healing well DVT Evaluation: No evidence of DVT seen on physical exam. Negative Homan's sign. No cords or calf tenderness. No significant calf/ankle edema.   Recent Labs  01/23/16 0624 01/24/16 0511  HGB 10.9* 8.7*  HCT 31.9* 25.5*    Assessment/Plan: Status post Cesarean section. Doing well postoperatively.  Continue current care.  CURTIS,CAROL G 01/24/2016, 9:32 AM

## 2016-01-24 NOTE — Progress Notes (Signed)
CSW acknowledges NICU admission.    Patient screened out for psychosocial assessment since none of the following apply:  Psychosocial stressors documented in mother or baby's chart  Gestation less than 32 weeks  Code at delivery   Infant with anomalies  Please contact the Clinical Social Worker if specific needs arise, or by MOB's request.       

## 2016-01-25 NOTE — Progress Notes (Signed)
Subjective: Postpartum Day 2: Cesarean Delivery Patient reports tolerating PO, + flatus and no problems voiding.    Objective: Vital signs in last 24 hours: Temp:  [97.9 F (36.6 C)-98.6 F (37 C)] 97.9 F (36.6 C) (04/06 0535) Pulse Rate:  [85-94] 85 (04/06 0535) Resp:  [14-16] 14 (04/06 0535) BP: (95-110)/(61-65) 95/61 mmHg (04/06 0535) SpO2:  [97 %-100 %] 97 % (04/06 0535)  Physical Exam:  General: alert and cooperative Lochia: appropriate Uterine Fundus: firm Incision: healing well DVT Evaluation: No evidence of DVT seen on physical exam. Negative Homan's sign. No cords or calf tenderness. No significant calf/ankle edema.   Recent Labs  01/23/16 0624 01/24/16 0511  HGB 10.9* 8.7*  HCT 31.9* 25.5*    Assessment/Plan: Status post Cesarean section. Doing well postoperatively.  Continue current care.  Crixus Mcaulay G 01/25/2016, 9:25 AM

## 2016-01-25 NOTE — Lactation Note (Signed)
This note was copied from a baby's chart. Lactation Consultation Note Womens unit RN requesting Lc to see mom to offer larger flange size and comfort gels.  Mom is away from room visiting in [facility]  LC called mom by phone.  Mom reports needing larger flange size due to friction while pumping.  Mom does not plan to return to room until late tonight and she just got to NICU.  LC offered to leave size 30 flanges for mom to try and encouraged lubricant like breast milk or coconut oil while pumping.  [facility]. Comfort gels left in room for mom as well.   Patient Name: Natalie Austin MilesLila Arai Kelley'XToday's Date: 01/25/2016     Maternal Data    Feeding    LATCH Score/Interventions                      Lactation Tools Discussed/Used     Consult Status      Shoptaw, Arvella MerlesJana Lynn 01/25/2016, 9:39 PM

## 2016-01-25 NOTE — Lactation Note (Signed)
This note was copied from a baby's chart. Lactation Consultation Note  Follow up visit made.  Mom is currently pumping and states she does every 3 hours.  She is following pumping with hand expression and obtains drops.  Reassured and instructed to continue pumping to establish milk supply.  No questions at present.  Patient Name: Natalie Austin MilesLila Livengood BJYNW'GToday's Date: 01/25/2016     Maternal Data    Feeding    LATCH Score/Interventions                      Lactation Tools Discussed/Used     Consult Status      Huston FoleyMOULDEN, [name] 01/25/2016, 12:25 PM

## 2016-01-26 NOTE — Progress Notes (Signed)
Subjective: Postpartum Day 3: Cesarean Delivery Patient reports incisional pain, tolerating PO, + flatus and no problems voiding.    Objective: Vital signs in last 24 hours: Temp:  [97.7 F (36.5 C)-98.4 F (36.9 C)] 97.7 F (36.5 C) (04/07 0529) Pulse Rate:  [72-90] 72 (04/07 0529) Resp:  [16-18] 16 (04/07 0529) BP: (85-117)/(50-77) 85/50 mmHg (04/07 0529) SpO2:  [98 %-100 %] 98 % (04/07 0529)  Physical Exam:  General: alert and cooperative Lochia: appropriate Uterine Fundus: firm Incision: healing well DVT Evaluation: No evidence of DVT seen on physical exam. Negative Homan's sign. No cords or calf tenderness. No significant calf/ankle edema.   Recent Labs  01/24/16 0511  HGB 8.7*  HCT 25.5*    Assessment/Plan: Status post Cesarean section. Doing well postoperatively.  Continue current care Plan discharge in am.  Tristina Sahagian G 01/26/2016, 8:02 AM

## 2016-01-26 NOTE — Lactation Note (Signed)
This note was copied from a baby's chart. Lactation Consultation Note  Patient Name: Natalie Austin MilesLila Pickford ZOXWR'UToday's Date: 01/26/2016 Reason for consult: Follow-up assessment;NICU baby  NICU baby 3674 hours old. Small blister on mom's left nipple is healing, and mom reports increased comfort now that she is using larger flanges #27s. Enc mom to use coconut oil at home and reduce flange size as needed. Mom reports that she is getting 3 or so ml with each pumping, and has had the baby STS. Enc mom to offer STS and nuzzling as much as she and baby able.  Maternal Data    Feeding    LATCH Score/Interventions                      Lactation Tools Discussed/Used     Consult Status Consult Status: Follow-up Date: 01/27/16 Follow-up type: In-patient    Geralynn OchsWILLIARD, Ayn Domangue 01/26/2016, 4:29 PM

## 2016-01-26 NOTE — Lactation Note (Signed)
This note was copied from a baby's chart. Lactation Consultation Note  Patient Name: Natalie Austin MilesLila Habib HQION'GToday's Date: 01/26/2016 Reason for consult: Follow-up assessment  NICU baby 4569 hours old. Mom on her way to NICU. Mom reports that the larger breast pump flange is much more comfortable. Discussed with mom how to determine if she needs to reduce flange size later after her breast milk comes to volume and/or any breast edema subsides. Enc mom to call for assistance as needed.  Maternal Data    Feeding Feeding Type: Formula Length of feed: 90 min  LATCH Score/Interventions                      Lactation Tools Discussed/Used     Consult Status Consult Status: Follow-up Date: 01/27/16 Follow-up type: In-patient    Geralynn OchsWILLIARD, Camyah Pultz 01/26/2016, 11:00 AM

## 2016-01-27 MED ORDER — OXYCODONE-ACETAMINOPHEN 5-325 MG PO TABS: 1.0000 | ORAL_TABLET | ORAL | Status: DC | PRN

## 2016-01-27 MED ORDER — IBUPROFEN 800 MG PO TABS: 800.0000 mg | ORAL_TABLET | Freq: Three times a day (TID) | ORAL | Status: DC | PRN

## 2016-01-27 NOTE — Discharge Summary (Signed)
Obstetric Discharge Summary Reason for Admission: bleeding, vasa previa Prenatal Procedures: obsv for vasa previa Intrapartum Procedures: cesarean: low cervical, transverse Postpartum Procedures: none Complications-Operative and Postpartum: none HEMOGLOBIN  Date Value Ref Range Status  01/24/2016 8.7* 12.0 - 15.0 g/dL Final    Comment:    DELTA CHECK NOTED REPEATED TO VERIFY    HCT  Date Value Ref Range Status  01/24/2016 25.5* 36.0 - 46.0 % Final    Physical Exam:  General: alert Lochia: appropriate Uterine Fundus: firm Incision: healing well DVT Evaluation: No evidence of DVT seen on physical exam.  Discharge Diagnoses: 34 week IUP, vasa previa, primary CS  Discharge Information: Date: 01/27/2016 Activity: pelvic rest Diet: routine Medications: PNV, Ibuprofen and Percocet Condition: stable Instructions: refer to practice specific booklet Discharge to: home Follow-up Information    Follow up with Natalie PicaHOLLAND,[name], MD. Schedule an appointment as soon as possible for a visit in 1 week.   Specialty:  Obstetrics and Gynecology   Contact information:   [address] [place] [address] [phone]       Newborn Data: Live born female  Birth Weight: 5 lb 1.1 oz (2299 g) APGAR: 5, 8  Home with stable in NICU.  Natalie Kelley Kelley 01/27/2016, 9:45 AM

## 2016-01-27 NOTE — Progress Notes (Signed)

## 2016-01-30 DIAGNOSIS — Z452 Encounter for adjustment and management of vascular access device: Secondary | ICD-10-CM | POA: Diagnosis not present

## 2016-01-31 ENCOUNTER — Ambulatory Visit: Payer: Self-pay

## 2016-01-31 NOTE — Lactation Note (Addendum)
This note was copied from a baby's chart. Lactation Consultation Note  Met with mom in the NICU.  Randel Books is now 62 days old and she is concerned about her supply.  Mom is pumping every 3 hours during the day and every 4 hours at night.  She is using a medela pump in style.  She is also taking herbal supplements.  Pumping 5-10 mls each pumping.  When talking to mom she stated she is stressed and getting minimal rest.  She has a 34 year old and teenager at home without much support.  Instructed to pump every 2-3 hours during the day and relax when pumping, increase rest(grandparents will help), and consider renting a symphony pump if no improvement in next day or two.  Mom states she had an abundant supply with previous babies.  Patient Name: Natalie Kelley KMQKM'M Date: 01/31/2016     Maternal Data    Feeding    LATCH Score/Interventions                      Lactation Tools Discussed/Used     Consult Status      Ave Filter 01/31/2016, 12:15 PM

## 2016-02-20 ENCOUNTER — Inpatient Hospital Stay (HOSPITAL_COMMUNITY)
Admission: RE | Admit: 2016-02-20 | Payer: BLUE CROSS/BLUE SHIELD | Source: Ambulatory Visit | Admitting: Obstetrics and Gynecology

## 2016-02-20 ENCOUNTER — Encounter (HOSPITAL_COMMUNITY): Admission: RE | Payer: Self-pay | Source: Ambulatory Visit

## 2016-02-20 LAB — HM PAP SMEAR: HM Pap smear: NORMAL

## 2016-02-20 SURGERY — Surgical Case
Anesthesia: Regional

## 2016-03-05 DIAGNOSIS — Z1389 Encounter for screening for other disorder: Secondary | ICD-10-CM | POA: Diagnosis not present

## 2016-07-16 ENCOUNTER — Ambulatory Visit (INDEPENDENT_AMBULATORY_CARE_PROVIDER_SITE_OTHER): Payer: BLUE CROSS/BLUE SHIELD | Admitting: Family

## 2016-07-16 ENCOUNTER — Encounter: Payer: Self-pay | Admitting: Family

## 2016-07-16 VITALS — BP 98/76 | HR 58 | Temp 98.0°F

## 2016-07-16 DIAGNOSIS — R300 Dysuria: Secondary | ICD-10-CM | POA: Diagnosis not present

## 2016-07-16 DIAGNOSIS — N3001 Acute cystitis with hematuria: Secondary | ICD-10-CM

## 2016-07-16 DIAGNOSIS — Z23 Encounter for immunization: Secondary | ICD-10-CM

## 2016-07-16 LAB — POCT URINALYSIS DIP (MANUAL ENTRY)
Bilirubin, UA: NEGATIVE
Glucose, UA: NEGATIVE
Nitrite, UA: NEGATIVE
SPEC GRAV UA: 1.015
UROBILINOGEN UA: 1
pH, UA: 8.5

## 2016-07-16 MED ORDER — CIPROFLOXACIN HCL 250 MG PO TABS: 250.0000 mg | ORAL_TABLET | Freq: Two times a day (BID) | ORAL | 0 refills | Status: DC

## 2016-07-16 NOTE — Progress Notes (Signed)
Pre visit review using our clinic review tool, if applicable. No additional management support is needed unless otherwise documented below in the visit note. 

## 2016-07-16 NOTE — Patient Instructions (Addendum)
Drink plenty of water and take antibiotic as prescribed.   Pump and dump during antibiotic and 2 days after.   We are pending the urine culture to know the organism causing the infection and our office will call you with results. If the particular organism requires a different antibiotic than the on prescribed, we will place an order for a new prescription at that time.   If you symptoms worsen or you have new symptoms, please contact our office, or return to clinic for re evaluation.  Urinary Tract Infection Urinary tract infections (UTIs) can develop anywhere along your urinary tract. Your urinary tract is your body's drainage system for removing wastes and extra water. Your urinary tract includes two kidneys, two ureters, a bladder, and a urethra. Your kidneys are a pair of bean-shaped organs. Each kidney is about the size of your fist. They are located below your ribs, one on each side of your spine. CAUSES Infections are caused by microbes, which are microscopic organisms, including fungi, viruses, and bacteria. These organisms are so small that they can only be seen through a microscope. Bacteria are the microbes that most commonly cause UTIs. SYMPTOMS  Symptoms of UTIs may vary by age and gender of the patient and by the location of the infection. Symptoms in young women typically include a frequent and intense urge to urinate and a painful, burning feeling in the bladder or urethra during urination. Older women and men are more likely to be tired, shaky, and weak and have muscle aches and abdominal pain. A fever may mean the infection is in your kidneys. Other symptoms of a kidney infection include pain in your back or sides below the ribs, nausea, and vomiting. DIAGNOSIS To diagnose a UTI, your caregiver will ask you about your symptoms. Your caregiver will also ask you to provide a urine sample. The urine sample will be tested for bacteria and white blood cells. White blood cells are made by  your body to help fight infection. TREATMENT  Typically, UTIs can be treated with medication. Because most UTIs are caused by a bacterial infection, they usually can be treated with the use of antibiotics. The choice of antibiotic and length of treatment depend on your symptoms and the type of bacteria causing your infection. HOME CARE INSTRUCTIONS  If you were prescribed antibiotics, take them exactly as your caregiver instructs you. Finish the medication even if you feel better after you have only taken some of the medication.  Drink enough water and fluids to keep your urine clear or pale yellow.  Avoid caffeine, tea, and carbonated beverages. They tend to irritate your bladder.  Empty your bladder often. Avoid holding urine for long periods of time.  Empty your bladder before and after sexual intercourse.  After a bowel movement, women should cleanse from front to back. Use each tissue only once. SEEK MEDICAL CARE IF:   You have back pain.  You develop a fever.  Your symptoms do not begin to resolve within 3 days. SEEK IMMEDIATE MEDICAL CARE IF:   You have severe back pain or lower abdominal pain.  You develop chills.  You have nausea or vomiting.  You have continued burning or discomfort with urination. MAKE SURE YOU:   Understand these instructions.  Will watch your condition.  Will get help right away if you are not doing well or get worse.   This information is not intended to replace advice given to you by your health care provider. Make sure  you discuss any questions you have with your health care provider.   Document Released: 07/17/2005 Document Revised: 06/28/2015 Document Reviewed: 11/15/2011 Elsevier Interactive Patient Education [employer]2016 Elsevier Inc.

## 2016-07-16 NOTE — Progress Notes (Signed)
Subjective:    Patient ID: Natalie Kelley, female    DOB: 1980/12/09, 35 y.o.   MRN: [id number]  CC: Natalie Kelley is a 35 y.o. female who presents today for an acute visit.    HPI: Patient here for acute visit with complaint of dysuria today. Endorses chills this morning.  No fevers, nausea, flank pain. No concern for STDs. Patient does not have recurrent UTIs.    HISTORY:  Past Medical History:  Diagnosis Date  . Abnormal pap 2009  . Ectopic pregnancy   . Miscarriage within last 12 months   . No pertinent past medical history    Past Surgical History:  Procedure Laterality Date  . CESAREAN SECTION N/A 01/23/2016   Procedure: CESAREAN SECTION;  Surgeon: Natalie Overlie, MD;  Location: [facility];  Service: Obstetrics;  Laterality: N/A;  primary edc 03/07/16   . CRYOTHERAPY  2009  . DILATION AND EVACUATION N/A 10/12/2014   Procedure: DILATATION AND EVACUATION;  Surgeon: Natalie Pica, MD;  Location: [facility];  Service: Gynecology;  Laterality: N/A;   Family History  Problem Relation Age of Onset  . Colon cancer Father   . Esophageal varices Father   . Hypertension Father   . Stroke Father   . Atrial fibrillation Mother   . Autism Son     Allergies: Aspirin; Penicillins; Sulfonamide derivatives; and Terbutaline No current outpatient prescriptions on file prior to visit.   No current facility-administered medications on file prior to visit.     Social History  Substance Use Topics  . Smoking status: Former Smoker    Packs/day: 0.50    Years: 3.00    Types: Cigarettes  . Smokeless tobacco: Never Used  . Alcohol use No    Review of Systems  Constitutional: Negative for chills and fever.  Respiratory: Negative for cough.   Cardiovascular: Negative for chest pain and palpitations.  Gastrointestinal: Negative for abdominal pain, nausea and vomiting.  Genitourinary: Positive for dysuria. Negative for flank pain, frequency, hematuria and vaginal discharge.      Objective:      BP 98/76 (BP Location: Left Arm, Patient Position: Sitting, Cuff Size: Normal)   Pulse (!) 58   Temp 98 F (36.7 C)    Physical Exam  Constitutional: She appears well-developed and well-nourished.  Cardiovascular: Normal rate, regular rhythm, normal heart sounds and normal pulses.   Pulmonary/Chest: Effort normal and breath sounds normal. She has no wheezes. She has no rhonchi. She has no rales.  Abdominal: There is no CVA tenderness.  Neurological: She is alert.  Skin: Skin is warm and dry.  Psychiatric: She has a normal mood and affect. Her speech is normal and behavior is normal. Thought content normal.  Vitals reviewed.      Assessment & Plan:   1. Dysuria/2. Acute cystitis with hematuria UA positive for nitrites, leukocytes. Decided to treat with ciprofloxacin and that patient would "pump and dump' as she is breast-feeding at this time. We had concern about using a cephalosporin due to cross sensitivity reaction; patient severely PCN allergic.    - POCT urinalysis dipstick - Urine Culture - ciprofloxacin (CIPRO) 250 MG tablet; Take 1 tablet (250 mg total) by mouth 2 (two) times daily.  Dispense: 6 tablet; Refill: 0    I have discontinued Ms. Reitter prenatal multivitamin, ibuprofen, and oxyCODONE-acetaminophen. I am also having her start on ciprofloxacin.   Meds ordered this encounter  Medications  . ciprofloxacin (CIPRO) 250 MG tablet    Sig: Take  1 tablet (250 mg total) by mouth 2 (two) times daily.    Dispense:  6 tablet    Refill:  0    Order Specific Question:   Supervising Provider    Answer:   Natalie Kelley [[id number]]    Return precautions given.   Risks, benefits, and alternatives of the medications and treatment plan prescribed today were discussed, and patient expressed understanding.   Education regarding symptom management and diagnosis given to patient on AVS.  Continue to follow with Natalie MannsMarne Tower, MD for routine health maintenance.   Natalie Kelley  and I agreed with plan.   Natalie PlowmanMargaret Nesha Counihan, FNP

## 2016-07-17 LAB — URINE CULTURE: Organism ID, Bacteria: NO GROWTH

## 2016-07-19 ENCOUNTER — Ambulatory Visit: Payer: BLUE CROSS/BLUE SHIELD | Admitting: Family

## 2016-08-12 ENCOUNTER — Ambulatory Visit (INDEPENDENT_AMBULATORY_CARE_PROVIDER_SITE_OTHER): Payer: BLUE CROSS/BLUE SHIELD | Admitting: Family Medicine

## 2016-08-12 ENCOUNTER — Other Ambulatory Visit: Payer: Self-pay | Admitting: Family Medicine

## 2016-08-12 ENCOUNTER — Encounter: Payer: Self-pay | Admitting: Family Medicine

## 2016-08-12 VITALS — BP 116/68 | HR 80 | Temp 98.0°F | Ht 59.5 in | Wt 131.8 lb

## 2016-08-12 DIAGNOSIS — Z113 Encounter for screening for infections with a predominantly sexual mode of transmission: Secondary | ICD-10-CM

## 2016-08-12 DIAGNOSIS — N898 Other specified noninflammatory disorders of vagina: Secondary | ICD-10-CM | POA: Diagnosis not present

## 2016-08-12 LAB — HIV ANTIBODY (ROUTINE TESTING W REFLEX): HIV 1&2 Ab, 4th Generation: NONREACTIVE

## 2016-08-12 LAB — POCT WET PREP (WET MOUNT)
KOH Wet Prep POC: NEGATIVE
TRICHOMONAS WET PREP HPF POC: ABSENT

## 2016-08-12 MED ORDER — CLINDAMYCIN PHOSPHATE 100 MG VA SUPP: 100.0000 mg | Freq: Every day | VAGINAL | 0 refills | Status: DC

## 2016-08-12 NOTE — Progress Notes (Signed)
Subjective:    Patient ID: Natalie Kelley, female    DOB: 1981/05/30, 35 y.o.   MRN: [id number]  HPI Here for STD screen  Found out her husband was cheating   He also had a "penile enhancement" in [place]   She has some irritation and a little itching (both internal and external)  No discharge  No odor   Husband is not being tested for STD  She will likely be leaving him   No cramping   No missed periods Not pregnant (husband had a vasectomy)    Patient Active Problem List   Diagnosis Date Noted  . Vaginal irritation 08/12/2016  . Screen for STD (sexually transmitted disease) 08/12/2016  . Vasa previa in labor and delivery, antepartum 01/02/2016  . Placental abruption in second trimester 11/29/2015  . Placenta previa 11/27/2015  . Immunity status testing 11/10/2015  . Routine general medical examination at a health care facility 03/13/2015  . Encounter for routine gynecological examination 03/13/2015  . Family history of colon cancer 03/13/2015  . ABNORMAL GLANDULAR PAPANICOLAOU SMEAR OF CERVIX 05/03/2008  . FIBROCYSTIC BREAST DISEASE 02/05/2007   Past Medical History:  Diagnosis Date  . Abnormal pap 2009  . Ectopic pregnancy   . Miscarriage within last 12 months   . No pertinent past medical history    Past Surgical History:  Procedure Laterality Date  . CESAREAN SECTION N/A 01/23/2016   Procedure: CESAREAN SECTION;  Surgeon: Richarda Overlie, MD;  Location: [facility];  Service: Obstetrics;  Laterality: N/A;  primary edc 03/07/16   . CRYOTHERAPY  2009  . DILATION AND EVACUATION N/A 10/12/2014   Procedure: DILATATION AND EVACUATION;  Surgeon: Meriel Pica, MD;  Location: [facility];  Service: Gynecology;  Laterality: N/A;   Social History  Substance Use Topics  . Smoking status: Former Smoker    Packs/day: 0.50    Years: 3.00    Types: Cigarettes  . Smokeless tobacco: Never Used  . Alcohol use No   Family History  Problem Relation Age of Onset  . Colon cancer  Father   . Esophageal varices Father   . Hypertension Father   . Stroke Father   . Atrial fibrillation Mother   . Autism Son    Allergies  Allergen Reactions  . Aspirin Hives    Pt is able to take ibuprofen without problems.  . Penicillins Hives    Has patient had a PCN reaction causing immediate rash, facial/tongue/throat swelling, SOB or lightheadedness with hypotension: No Has patient had a PCN reaction causing severe rash involving mucus membranes or skin necrosis: No Has patient had a PCN reaction that required hospitalization No Has patient had a PCN reaction occurring within the last 10 years: Yes If all of the above answers are "NO", then may proceed with Cephalosporin use.   . Sulfonamide Derivatives Hives and Other (See Comments)    fever  . Terbutaline Hives    Skin discoloration and skin peeling   No current outpatient prescriptions on file prior to visit.   No current facility-administered medications on file prior to visit.     Review of Systems Review of Systems  Constitutional: Negative for fever, appetite change, fatigue and unexpected weight change.  Eyes: Negative for pain and visual disturbance.  Respiratory: Negative for cough and shortness of breath.   Cardiovascular: Negative for cp or palpitations    Gastrointestinal: Negative for nausea, diarrhea and constipation.  Genitourinary: Negative for urgency and frequency. pos for vaginal irritation w/o  lesions or d/c  Skin: Negative for pallor or rash   Neurological: Negative for weakness, light-headedness, numbness and headaches.  Hematological: Negative for adenopathy. Does not bruise/bleed easily.  Psychiatric/Behavioral: Negative for dysphoric mood. The patient is not nervous/anxious.         Objective:   Physical Exam  Constitutional: She appears well-developed and well-nourished.  HENT:  Head: Normocephalic and atraumatic.  Eyes: Conjunctivae and EOM are normal. Pupils are equal, round, and  reactive to light.  Neck: Normal range of motion. Neck supple.  Abdominal: Soft. Bowel sounds are normal. She exhibits no distension. There is no tenderness.  No suprapubic tenderness or fullness    Genitourinary:  Genitourinary Comments: Hyperemic vaginal mucosa  Scant clear d/c No odor  No CMT  No lesions or tenderness  Wet prep shows clue cells   Lymphadenopathy:    She has no cervical adenopathy.  Neurological: She is alert.  Skin: No rash noted. No pallor.  Psychiatric: She has a normal mood and affect.          Assessment & Plan:   Problem List Items Addressed This Visit      Other   Screen for STD (sexually transmitted disease)    Infidelity and ? Exposure  Some vaginal irritation- and BV (treating)  Of note-pt is breast feeding as well   Send gc/chlam probe, HIV/RPR and Hep C tests  Unremarkable exam except for hyperemic vaginal mucosa       Relevant Orders   GC/Chlamydia Probe Amp   Hepatitis C antibody   HIV antibody (with reflex)   RPR   Vaginal irritation    Pt has possible exp to STD- tests sent  Wet prep shows clue cells/ likely BV tx with vaginal cleocin times 3 days Update if not starting to improve in a week or if worsening         Relevant Orders   POCT Wet Prep [facility] [place])    Other Visit Diagnoses   None.

## 2016-08-12 NOTE — Assessment & Plan Note (Signed)
Pt has possible exp to STD- tests sent  Wet prep shows clue cells/ likely BV tx with vaginal cleocin times 3 days Update if not starting to improve in a week or if worsening

## 2016-08-12 NOTE — Patient Instructions (Signed)
Labs today  Use the cleocin vaginal suppository nightly for 3 days for bacterial vaginosis  If new symptoms develop let us know  Update if not starting to improve in a week or if worsening

## 2016-08-12 NOTE — Assessment & Plan Note (Signed)
Infidelity and ? Exposure  Some vaginal irritation- and BV (treating)  Of note-pt is breast feeding as well   Send gc/chlam probe, HIV/RPR and Hep C tests  Unremarkable exam except for hyperemic vaginal mucosa

## 2016-08-12 NOTE — Progress Notes (Signed)
Pre visit review using our clinic review tool, if applicable. No additional management support is needed unless otherwise documented below in the visit note. 

## 2016-08-13 LAB — GC/CHLAMYDIA PROBE AMP
CT PROBE, AMP APTIMA: NOT DETECTED
GC PROBE AMP APTIMA: NOT DETECTED

## 2016-08-13 LAB — RPR

## 2016-08-13 LAB — HEPATITIS C ANTIBODY: HCV AB: NEGATIVE

## 2016-11-18 ENCOUNTER — Ambulatory Visit (INDEPENDENT_AMBULATORY_CARE_PROVIDER_SITE_OTHER): Payer: BLUE CROSS/BLUE SHIELD | Admitting: Family Medicine

## 2016-11-18 ENCOUNTER — Encounter: Payer: Self-pay | Admitting: Family Medicine

## 2016-11-18 VITALS — BP 106/68 | HR 61 | Temp 98.3°F | Ht 59.5 in | Wt 130.8 lb

## 2016-11-18 DIAGNOSIS — Z0000 Encounter for general adult medical examination without abnormal findings: Secondary | ICD-10-CM | POA: Diagnosis not present

## 2016-11-18 DIAGNOSIS — Z23 Encounter for immunization: Secondary | ICD-10-CM

## 2016-11-18 DIAGNOSIS — Z800 Family history of malignant neoplasm of digestive organs: Secondary | ICD-10-CM | POA: Diagnosis not present

## 2016-11-18 LAB — COMPREHENSIVE METABOLIC PANEL
ALK PHOS: 64 U/L (ref 39–117)
ALT: 11 U/L (ref 0–35)
AST: 12 U/L (ref 0–37)
Albumin: 4.3 g/dL (ref 3.5–5.2)
BUN: 15 mg/dL (ref 6–23)
CALCIUM: 9.6 mg/dL (ref 8.4–10.5)
CO2: 28 mEq/L (ref 19–32)
Chloride: 103 mEq/L (ref 96–112)
Creatinine, Ser: 0.83 mg/dL (ref 0.40–1.20)
GFR: 83.38 mL/min (ref >60.00)
Glucose, Bld: 101 mg/dL — ABNORMAL HIGH (ref 70–99)
POTASSIUM: 3.8 meq/L (ref 3.5–5.1)
SODIUM: 136 meq/L (ref 135–145)
TOTAL PROTEIN: 7.6 g/dL (ref 6.0–8.3)
Total Bilirubin: 0.3 mg/dL (ref 0.2–1.2)

## 2016-11-18 LAB — LIPID PANEL
CHOLESTEROL: 187 mg/dL (ref 0–200)
HDL: 49.3 mg/dL (ref >39.00)
LDL Cholesterol: 104 mg/dL — ABNORMAL HIGH (ref 0–99)
NonHDL: 137.45
Total CHOL/HDL Ratio: 4
Triglycerides: 165 mg/dL — ABNORMAL HIGH (ref 0.0–149.0)
VLDL: 33 mg/dL (ref 0.0–40.0)

## 2016-11-18 LAB — CBC WITH DIFFERENTIAL/PLATELET
Basophils Absolute: 0 10*3/uL (ref 0.0–0.1)
Basophils Relative: 0.5 % (ref 0.0–3.0)
EOS PCT: 1 % (ref 0.0–5.0)
Eosinophils Absolute: 0.1 10*3/uL (ref 0.0–0.7)
HEMATOCRIT: 41.1 % (ref 36.0–46.0)
HEMOGLOBIN: 13.3 g/dL (ref 12.0–15.0)
LYMPHS PCT: 39.3 % (ref 12.0–46.0)
Lymphs Abs: 3.1 10*3/uL (ref 0.7–4.0)
MCHC: 32.3 g/dL (ref 30.0–36.0)
MCV: 75.2 fl — ABNORMAL LOW (ref 78.0–100.0)
MONOS PCT: 5.3 % (ref 3.0–12.0)
Monocytes Absolute: 0.4 10*3/uL (ref 0.1–1.0)
Neutro Abs: 4.3 10*3/uL (ref 1.4–7.7)
Neutrophils Relative %: 53.9 % (ref 43.0–77.0)
Platelets: 333 10*3/uL (ref 150.0–400.0)
RBC: 5.47 Mil/uL — AB (ref 3.87–5.11)
RDW: 14.4 % (ref 11.5–15.5)
WBC: 8 10*3/uL (ref 4.0–10.5)

## 2016-11-18 LAB — TSH: TSH: 1.47 u[IU]/mL (ref 0.35–4.50)

## 2016-11-18 NOTE — Progress Notes (Signed)
Subjective:    Patient ID: Natalie Kelley, female    DOB: [birth year], 36 y.o.   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   bmi is 25.9  Pap 5/17 after her baby was born  Sees gyn next may  Not getting mammograms yet   Tetanus shot 4/11  (Tdap)  Flu shot 9/17  Per pt-needs MMR vaccine (forgot to get after her last delivery)   Neg HIV screen 10/17  Family hx of colon cancer - in father (deceased)  She could not do her colonoscopy last time because she got pregnant   Anemic in April after baby born Not on iron  Lab Results  Component Value Date   WBC 11.5 (H) 01/24/2016   HGB 8.7 (L) 01/24/2016   HCT 25.5 (L) 01/24/2016   MCV 71.8 (L) 01/24/2016   PLT 209 01/24/2016    Will re check labs today   Patient Active Problem List   Diagnosis Date Noted  . Vasa previa in labor and delivery, antepartum 01/02/2016  . Placental abruption in second trimester 11/29/2015  . Placenta previa 11/27/2015  . Immunity status testing 11/10/2015  . Routine general medical examination at a health care facility 03/13/2015  . Encounter for routine gynecological examination 03/13/2015  . Family history of colon cancer 03/13/2015  . FIBROCYSTIC BREAST DISEASE 02/05/2007   Past Medical History:  Diagnosis Date  . Abnormal pap 2009  . Ectopic pregnancy   . Miscarriage within last 12 months   . No pertinent past medical history    Past Surgical History:  Procedure Laterality Date  . CESAREAN SECTION N/A 01/23/2016   Procedure: CESAREAN SECTION;  Surgeon: Molli Posey, MD;  Location: [facility];  Service: Obstetrics;  Laterality: N/A;  primary edc 03/07/16   . CRYOTHERAPY  2009  . DILATION AND EVACUATION N/A 10/12/2014   Procedure: DILATATION AND EVACUATION;  Surgeon: Margarette Asal, MD;  Location: [facility];  Service: Gynecology;  Laterality: N/A;   Social History  Substance Use Topics  . Smoking status: Former Smoker    Packs/day: 0.50    Years: 3.00    Types: Cigarettes  . Smokeless tobacco: Never Used  . Alcohol use No   Family History  Problem Relation Age of Onset  . Colon cancer Father   . Esophageal varices Father   . Hypertension Father   . Stroke Father   . Atrial fibrillation Mother   . Autism Son    Allergies  Allergen Reactions  . Aspirin Hives    Pt is able to take ibuprofen without problems.  . Penicillins Hives    Has patient had a PCN reaction causing immediate rash, facial/tongue/throat swelling, SOB or lightheadedness with hypotension: No Has patient had a PCN reaction causing severe rash involving mucus membranes or skin necrosis: No Has patient had a PCN reaction that required hospitalization No Has patient had a PCN reaction occurring within the last 10 years: Yes If all of the above answers are "NO", then may proceed with Cephalosporin use.   . Sulfonamide Derivatives Hives  and Other (See Comments)    fever  . Terbutaline Hives    Skin discoloration and skin peeling   No current outpatient prescriptions on file prior to visit.   No current facility-administered medications on file prior to visit.     Review of Systems    Review of Systems  Constitutional: Negative for fever, appetite change, fatigue and unexpected weight change.  Eyes: Negative for pain and visual disturbance.  Respiratory: Negative for cough and shortness of breath.   Cardiovascular: Negative for cp or palpitations    Gastrointestinal: Negative for nausea, diarrhea and constipation.  Genitourinary: Negative for urgency and frequency.  Skin: Negative for pallor or rash   Neurological: Negative for weakness, light-headedness,  numbness and headaches.  Hematological: Negative for adenopathy. Does not bruise/bleed easily.  Psychiatric/Behavioral: Negative for dysphoric mood. The patient is not nervous/anxious.      Objective:   Physical Exam  Constitutional: She appears well-developed and well-nourished. No distress.  Well appearing   HENT:  Head: Normocephalic and atraumatic.  Right Ear: External ear normal.  Left Ear: External ear normal.  Nose: Nose normal.  Mouth/Throat: Oropharynx is clear and moist.  Eyes: Conjunctivae and EOM are normal. Pupils are equal, round, and reactive to light. Right eye exhibits no discharge. Left eye exhibits no discharge. No scleral icterus.  Neck: Normal range of motion. Neck supple. No JVD present. Carotid bruit is not present. No thyromegaly present.  Cardiovascular: Normal rate, regular rhythm, normal heart sounds and intact distal pulses.  Exam reveals no gallop.   Pulmonary/Chest: Effort normal and breath sounds normal. No respiratory distress. She has no wheezes. She has no rales.  Abdominal: Soft. Bowel sounds are normal. She exhibits no distension and no mass. There is no tenderness.  Genitourinary:  Genitourinary Comments: Breast and pelvic exam are done by her gyn  Musculoskeletal: She exhibits no edema or tenderness.  Lymphadenopathy:    She has no cervical adenopathy.  Neurological: She is alert. She has normal reflexes. No cranial nerve deficit. She exhibits normal muscle tone. Coordination normal.  Skin: Skin is warm and dry. No rash noted. No erythema. No pallor.  Psychiatric: She has a normal mood and affect.          Assessment & Plan:   Problem List Items Addressed This Visit      Other   Family history of colon cancer    Father had colon cancer in his 70s Ref for screening colonoscopy (had one scheduled previously and then cancelled it when she found out she was pregnant)      Relevant Orders   Ambulatory referral to Gastroenterology    Routine general medical examination at a health care facility - Primary    Reviewed health habits including diet and exercise and skin cancer prevention Reviewed appropriate screening tests for age  Also reviewed health mt list, fam hx and immunization status , as well as social and family history   See HPI Labs today  MMR vaccine today  Ref for first colonoscopy due to fam hx       Relevant Orders   CBC with Differential/Platelet   Comprehensive metabolic panel   Lipid panel   TSH    Other Visit Diagnoses    Need for MMR vaccine       Relevant Orders   MMR vaccine subcutaneous (Completed)

## 2016-11-18 NOTE — Assessment & Plan Note (Signed)
Reviewed health habits including diet and exercise and skin cancer prevention Reviewed appropriate screening tests for age  Also reviewed health mt list, fam hx and immunization status , as well as social and family history   See HPI Labs today  MMR vaccine today  Ref for first colonoscopy due to fam hx

## 2016-11-18 NOTE — Progress Notes (Signed)
Pre visit review using our clinic review tool, if applicable. No additional management support is needed unless otherwise documented below in the visit note. 

## 2016-11-18 NOTE — Patient Instructions (Addendum)
Stop at check out for referral for colonoscopy MMR vaccine today   Labs today  Stay as active as you can be  Eat a healthy balanced diet

## 2016-11-18 NOTE — Assessment & Plan Note (Signed)
Father had colon cancer in his 5940s Ref for screening colonoscopy (had one scheduled previously and then cancelled it when she found out she was pregnant)

## 2016-11-19 ENCOUNTER — Encounter: Payer: Self-pay | Admitting: *Deleted

## 2016-11-19 ENCOUNTER — Ambulatory Visit: Payer: BLUE CROSS/BLUE SHIELD | Admitting: Gastroenterology

## 2016-11-25 ENCOUNTER — Encounter: Payer: Self-pay | Admitting: Family Medicine

## 2017-03-13 DIAGNOSIS — Z6826 Body mass index (BMI) 26.0-26.9, adult: Secondary | ICD-10-CM | POA: Diagnosis not present

## 2017-03-13 DIAGNOSIS — Z01419 Encounter for gynecological examination (general) (routine) without abnormal findings: Secondary | ICD-10-CM | POA: Diagnosis not present

## 2017-11-03 DIAGNOSIS — N3001 Acute cystitis with hematuria: Secondary | ICD-10-CM | POA: Diagnosis not present

## 2017-11-03 DIAGNOSIS — R300 Dysuria: Secondary | ICD-10-CM | POA: Diagnosis not present

## 2017-11-11 ENCOUNTER — Encounter: Payer: Self-pay | Admitting: Family Medicine

## 2017-11-11 ENCOUNTER — Ambulatory Visit: Payer: BLUE CROSS/BLUE SHIELD | Admitting: Family Medicine

## 2017-11-11 VITALS — BP 92/58 | HR 96 | Temp 99.0°F | Wt 130.5 lb

## 2017-11-11 DIAGNOSIS — R05 Cough: Secondary | ICD-10-CM | POA: Diagnosis not present

## 2017-11-11 DIAGNOSIS — R059 Cough, unspecified: Secondary | ICD-10-CM

## 2017-11-11 LAB — POC INFLUENZA A&B (BINAX/QUICKVUE)
Influenza A, POC: NEGATIVE
Influenza B, POC: NEGATIVE

## 2017-11-11 MED ORDER — OSELTAMIVIR PHOSPHATE 75 MG PO CAPS: 75.0000 mg | ORAL_CAPSULE | Freq: Two times a day (BID) | ORAL | 0 refills | Status: DC

## 2017-11-11 NOTE — Progress Notes (Signed)
Body aches, chills, rigors.  HA.  Sx started <48 hours ago.  Flu shot prev done at work.  No vomiting, no diarrhea.  Some cough.  Some sputum, yellowish.  Ear pain yesterday.  Some sinus pressure.  Temp 102 yesterday.   Works at [employer].  Mult sick exposures at work.  Known exposure to flu A with a recently ill patient.   Meds, vitals, and allergies reviewed.   ROS: Per HPI unless specifically indicated in ROS section   GEN: nad, alert and oriented HEENT: mucous membranes moist, tm w/o erythema, nasal exam w/o erythema, clear discharge noted,  OP with cobblestoning NECK: supple w/o LA CV: rrr.   PULM: ctab, no inc wob EXT: no edema

## 2017-11-11 NOTE — Patient Instructions (Signed)
Start tamiflu and out of work in the meantime.  Take care.  Glad to see you.  Update us as needed.

## 2017-11-12 DIAGNOSIS — R059 Cough, unspecified: Secondary | ICD-10-CM | POA: Insufficient documentation

## 2017-11-12 DIAGNOSIS — R05 Cough: Secondary | ICD-10-CM | POA: Insufficient documentation

## 2017-11-12 NOTE — Assessment & Plan Note (Signed)
With flulike symptoms.  Lungs are still clear.  She typically has a low blood pressure at baseline.  At this point still okay for outpatient follow-up.  Did run a flu test, it was negative, but there is still a chance that the patient could have flu.  Given the risk/reward this scenario, I think it still makes sense for her to start Tamiflu.  Discussed with patient.  She agrees. Start tamiflu and out of work in the meantime.  Update us as needed.

## 2017-11-21 ENCOUNTER — Other Ambulatory Visit: Payer: Self-pay | Admitting: Family Medicine

## 2017-11-21 NOTE — Telephone Encounter (Signed)
What for - symptoms or exposure?   Thanks

## 2017-11-21 NOTE — Telephone Encounter (Signed)
Pt said she didn't request this and it must be on auto refill, Rx declined

## 2017-11-21 NOTE — Telephone Encounter (Signed)
Electronic refill request. Tamiflu Last office visit:   11/11/2017 Last Filled:     10 capsule 0 11/11/2017  Please advise.

## 2018-02-19 LAB — HM PAP SMEAR: HM Pap smear: NORMAL

## 2018-03-24 DIAGNOSIS — Z6825 Body mass index (BMI) 25.0-25.9, adult: Secondary | ICD-10-CM | POA: Diagnosis not present

## 2018-03-24 DIAGNOSIS — Z01419 Encounter for gynecological examination (general) (routine) without abnormal findings: Secondary | ICD-10-CM | POA: Diagnosis not present

## 2018-04-15 DIAGNOSIS — Z800 Family history of malignant neoplasm of digestive organs: Secondary | ICD-10-CM | POA: Diagnosis not present

## 2018-06-09 DIAGNOSIS — Z1211 Encounter for screening for malignant neoplasm of colon: Secondary | ICD-10-CM | POA: Diagnosis not present

## 2018-06-09 DIAGNOSIS — Z800 Family history of malignant neoplasm of digestive organs: Secondary | ICD-10-CM | POA: Diagnosis not present

## 2019-02-05 DIAGNOSIS — R0989 Other specified symptoms and signs involving the circulatory and respiratory systems: Secondary | ICD-10-CM | POA: Diagnosis not present

## 2019-02-05 DIAGNOSIS — Z20828 Contact with and (suspected) exposure to other viral communicable diseases: Secondary | ICD-10-CM | POA: Diagnosis not present

## 2019-05-05 ENCOUNTER — Telehealth: Payer: Self-pay | Admitting: Family Medicine

## 2019-05-05 NOTE — Telephone Encounter (Signed)
Left VM letting pt know imm record ready for pick up 

## 2019-05-05 NOTE — Telephone Encounter (Signed)
Best number [phone] Pt called needing her immunization records Pt let pt know when they are ready for pick up

## 2019-07-15 ENCOUNTER — Other Ambulatory Visit: Payer: Self-pay

## 2019-07-15 ENCOUNTER — Encounter: Payer: Self-pay | Admitting: Family Medicine

## 2019-07-15 ENCOUNTER — Ambulatory Visit: Payer: BLUE CROSS/BLUE SHIELD | Admitting: Family Medicine

## 2019-07-15 VITALS — BP 116/68 | HR 64 | Temp 97.4°F | Ht 59.25 in | Wt 127.2 lb

## 2019-07-15 DIAGNOSIS — Z800 Family history of malignant neoplasm of digestive organs: Secondary | ICD-10-CM

## 2019-07-15 DIAGNOSIS — Z0184 Encounter for antibody response examination: Secondary | ICD-10-CM

## 2019-07-15 DIAGNOSIS — Z209 Contact with and (suspected) exposure to unspecified communicable disease: Secondary | ICD-10-CM | POA: Diagnosis not present

## 2019-07-15 DIAGNOSIS — Z020 Encounter for examination for admission to educational institution: Secondary | ICD-10-CM | POA: Diagnosis not present

## 2019-07-15 DIAGNOSIS — Z0000 Encounter for general adult medical examination without abnormal findings: Secondary | ICD-10-CM

## 2019-07-15 LAB — COMPREHENSIVE METABOLIC PANEL
ALT: 9 U/L (ref 0–35)
AST: 11 U/L (ref 0–37)
Albumin: 4.1 g/dL (ref 3.5–5.2)
Alkaline Phosphatase: 56 U/L (ref 39–117)
BUN: 8 mg/dL (ref 6–23)
CO2: 28 mEq/L (ref 19–32)
Calcium: 9.1 mg/dL (ref 8.4–10.5)
Chloride: 104 mEq/L (ref 96–112)
Creatinine, Ser: 0.74 mg/dL (ref 0.40–1.20)
GFR: 88.22 mL/min (ref >60.00)
Glucose, Bld: 99 mg/dL (ref 70–99)
Potassium: 3.4 mEq/L — ABNORMAL LOW (ref 3.5–5.1)
Sodium: 139 mEq/L (ref 135–145)
Total Bilirubin: 0.4 mg/dL (ref 0.2–1.2)
Total Protein: 6.8 g/dL (ref 6.0–8.3)

## 2019-07-15 LAB — CBC WITH DIFFERENTIAL/PLATELET
Basophils Absolute: 0 10*3/uL (ref 0.0–0.1)
Basophils Relative: 0.5 % (ref 0.0–3.0)
Eosinophils Absolute: 0.1 10*3/uL (ref 0.0–0.7)
Eosinophils Relative: 1 % (ref 0.0–5.0)
HCT: 37.9 % (ref 36.0–46.0)
Hemoglobin: 12.3 g/dL (ref 12.0–15.0)
Lymphocytes Relative: 36.3 % (ref 12.0–46.0)
Lymphs Abs: 2.6 10*3/uL (ref 0.7–4.0)
MCHC: 32.4 g/dL (ref 30.0–36.0)
MCV: 75.7 fl — ABNORMAL LOW (ref 78.0–100.0)
Monocytes Absolute: 0.4 10*3/uL (ref 0.1–1.0)
Monocytes Relative: 5.1 % (ref 3.0–12.0)
Neutro Abs: 4.1 10*3/uL (ref 1.4–7.7)
Neutrophils Relative %: 57.1 % (ref 43.0–77.0)
Platelets: 290 10*3/uL (ref 150.0–400.0)
RBC: 5 Mil/uL (ref 3.87–5.11)
RDW: 13.9 % (ref 11.5–15.5)
WBC: 7.2 10*3/uL (ref 4.0–10.5)

## 2019-07-15 LAB — LIPID PANEL
Cholesterol: 163 mg/dL (ref 0–200)
HDL: 46 mg/dL (ref >39.00)
LDL Cholesterol: 100 mg/dL — ABNORMAL HIGH (ref 0–99)
NonHDL: 116.69
Total CHOL/HDL Ratio: 4
Triglycerides: 84 mg/dL (ref 0.0–149.0)
VLDL: 16.8 mg/dL (ref 0.0–40.0)

## 2019-07-15 LAB — TSH: TSH: 0.75 u[IU]/mL (ref 0.35–4.50)

## 2019-07-15 NOTE — Assessment & Plan Note (Signed)
Pt is in school for NP  She needs titers from 2017 re done for hep B, MMR, varicella  Also hep C screen Also TB gold  Done today

## 2019-07-15 NOTE — Assessment & Plan Note (Signed)
Reviewed health habits including diet and exercise and skin cancer prevention Reviewed appropriate screening tests for age  Also reviewed health mt list, fam hx and immunization status , as well as social and family history   Doing well  Up to date with gyn care and plans f/u soon  Enc her to continue self breast exams  MVI daily if diet is not balanced (in school and working) [employer] exercise and outdoor time when able  Wellness labs today  Form filled out for NP school- also did titers that needed to be updated She has had her flu shot

## 2019-07-15 NOTE — Assessment & Plan Note (Signed)
Pt had her colonoscopy 8/19 and it was normal Planning 5 y recall  Her father had colon cancer in his 54s (suspected-it had already spread)

## 2019-07-15 NOTE — Progress Notes (Signed)
Subjective:    Patient ID: Natalie Kelley, female    DOB: [birth year], 38 y.o.   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   Feeling ok  Tired in general  On nights right now   Needs titers for hep B and MMR and varicella   Had her flu shot   Pap 5/17 neg-gyn  She 5/19 nl -neg pap (gyn is Dr Matthew Saras)    Breast cancer screening /self breast exam  No lumps  Has not had a mammogram yet   Flu vaccine - utd   Tdap 4/11-wants to update   Father had colon cancer - in 41s  Pt had colonoscopy 8/19 -normal  5 year recall due to family hx    Has form to fill out for nursing   Wt Readings from Last 3 Encounters:  07/15/19 127 lb 4 oz (57.7 kg)  11/11/17 130 lb 8 oz (59.2 kg)  11/18/16 130 lb 12 oz (59.3 kg)  eating fairly /does snack  Not a lot of exercise with schedule - active jog  25.48 kg/m   BP Readings from Last 3 Encounters:  07/15/19 116/68  11/11/17 (!) 92/58  11/18/16 106/68     Pulse Readings from Last 3 Encounters:  07/15/19 64  11/11/17 96  11/18/16 61    Due for labs today   Patient Active Problem List   Diagnosis Date Noted  . Cough 11/12/2017  . Vasa previa in labor and delivery, antepartum 01/02/2016  . Placental abruption in second trimester 11/29/2015  . Placenta previa 11/27/2015  . Immunity status testing 11/10/2015  . Routine general medical examination at a health care facility 03/13/2015  . Encounter for routine gynecological examination 03/13/2015  . Family history of colon cancer 03/13/2015  . FIBROCYSTIC BREAST DISEASE 02/05/2007   Past Medical History:  Diagnosis Date  . Abnormal pap 2009  . Ectopic pregnancy   . Miscarriage within last 12 months   . No pertinent past medical history    Past Surgical History:  Procedure Laterality Date  . CESAREAN SECTION N/A 01/23/2016   Procedure: CESAREAN SECTION;  Surgeon:  Molli Posey, MD;  Location: [facility];  Service: Obstetrics;  Laterality: N/A;  primary edc 03/07/16   . CRYOTHERAPY  2009  . DILATION AND EVACUATION N/A 10/12/2014   Procedure: DILATATION AND EVACUATION;  Surgeon: Margarette Asal, MD;  Location: [facility];  Service: Gynecology;  Laterality: N/A;   Social History   Tobacco Use  . Smoking status: Former Smoker    Packs/day: 0.50    Years: 3.00    Pack years: 1.50    Types: Cigarettes  . Smokeless tobacco: Never Used  Substance Use Topics  . Alcohol use: No    Alcohol/week: 0.0 standard drinks  . Drug use: No   Family History  Problem Relation Age of Onset  . Colon cancer Father   . Esophageal varices Father   . Hypertension Father   . Stroke Father   . Atrial fibrillation Mother   . Autism Son    Allergies  Allergen Reactions  . Aspirin Hives    Pt is able to take ibuprofen without problems.  . Penicillins Hives    Has patient had a PCN reaction causing immediate rash, facial/tongue/throat swelling, SOB or lightheadedness with hypotension: No Has patient had a PCN reaction causing severe rash  involving mucus membranes or skin necrosis: No Has patient had a PCN reaction that required hospitalization No Has patient had a PCN reaction occurring within the last 10 years: Yes If all of the above answers are "NO", then may proceed with Cephalosporin use.   . Sulfonamide Derivatives Hives and Other (See Comments)    fever  . Terbutaline Hives    Skin discoloration and skin peeling   No current outpatient medications on file prior to visit.   No current facility-administered medications on file prior to visit.     Review of Systems  Constitutional: Positive for fatigue. Negative for activity change, appetite change, fever and unexpected weight change.       Fatigue from work and school schedule  HENT: Negative for congestion, ear pain, rhinorrhea, sinus pressure and sore throat.   Eyes: Negative for pain, redness and  visual disturbance.  Respiratory: Negative for cough, shortness of breath and wheezing.   Cardiovascular: Negative for chest pain and palpitations.  Gastrointestinal: Negative for abdominal pain, blood in stool, constipation and diarrhea.  Endocrine: Negative for polydipsia and polyuria.  Genitourinary: Negative for dysuria, frequency and urgency.  Musculoskeletal: Negative for arthralgias, back pain and myalgias.  Skin: Negative for pallor and rash.  Allergic/Immunologic: Negative for environmental allergies.  Neurological: Negative for dizziness, syncope and headaches.  Hematological: Negative for adenopathy. Does not bruise/bleed easily.  Psychiatric/Behavioral: Negative for decreased concentration and dysphoric mood. The patient is not nervous/anxious.        Objective:   Physical Exam Constitutional:      General: She is not in acute distress.    Appearance: Normal appearance. She is well-developed and normal weight. She is not ill-appearing or diaphoretic.  HENT:     Head: Normocephalic and atraumatic.     Right Ear: Tympanic membrane, ear canal and external ear normal.     Left Ear: Tympanic membrane, ear canal and external ear normal.     Nose: Nose normal. No congestion.     Mouth/Throat:     Mouth: Mucous membranes are moist.     Pharynx: Oropharynx is clear. No posterior oropharyngeal erythema.  Eyes:     General: No scleral icterus.    Extraocular Movements: Extraocular movements intact.     Conjunctiva/sclera: Conjunctivae normal.     Pupils: Pupils are equal, round, and reactive to light.  Neck:     Musculoskeletal: Normal range of motion and neck supple. No neck rigidity or muscular tenderness.     Thyroid: No thyromegaly.     Vascular: No carotid bruit or JVD.  Cardiovascular:     Rate and Rhythm: Normal rate and regular rhythm.     Pulses: Normal pulses.     Heart sounds: Normal heart sounds. No gallop.   Pulmonary:     Effort: Pulmonary effort is normal. No  respiratory distress.     Breath sounds: Normal breath sounds. No wheezing.     Comments: Good air exch Chest:     Chest wall: No tenderness.  Abdominal:     General: Bowel sounds are normal. There is no distension or abdominal bruit.     Palpations: Abdomen is soft. There is no mass.     Tenderness: There is no abdominal tenderness.     Hernia: No hernia is present.  Genitourinary:    Comments: Breast and pelvic exams are done by her gyn Musculoskeletal: Normal range of motion.        General: No tenderness.  Right lower leg: No edema.     Left lower leg: No edema.  Lymphadenopathy:     Cervical: No cervical adenopathy.  Skin:    General: Skin is warm and dry.     Coloration: Skin is not pale.     Findings: No erythema or rash.  Neurological:     Mental Status: She is alert. Mental status is at baseline.     Cranial Nerves: No cranial nerve deficit.     Motor: No abnormal muscle tone.     Coordination: Coordination normal.     Gait: Gait normal.     Deep Tendon Reflexes: Reflexes are normal and symmetric.  Psychiatric:        Mood and Affect: Mood normal.     Comments: Pleasant and talkative           Assessment & Plan:   Problem List Items Addressed This Visit      Other   Routine general medical examination at a health care facility - Primary    Reviewed health habits including diet and exercise and skin cancer prevention Reviewed appropriate screening tests for age  Also reviewed health mt list, fam hx and immunization status , as well as social and family history   Doing well  Up to date with gyn care and plans f/u soon  Enc her to continue self breast exams  MVI daily if diet is not balanced (in school and working) [employer] exercise and outdoor time when able  Wellness labs today  Form filled out for NP school- also did titers that needed to be updated She has had her flu shot       Relevant Orders   CBC with Differential/Platelet (Completed)    Comprehensive metabolic panel (Completed)   Lipid panel (Completed)   TSH (Completed)   Family history of colon cancer    Pt had her colonoscopy 8/19 and it was normal Planning 5 y recall  Her father had colon cancer in his 27s (suspected-it had already spread)      Immunity status testing    Pt is in school for NP  She needs titers from 2017 re done for hep B, MMR, varicella  Also hep C screen Also TB gold  Done today        Relevant Orders   Varicella zoster antibody, IgG   Measles/Mumps/Rubella Immunity   Hepatitis C antibody   QuantiFERON-TB Gold Plus   Hepatitis B surface antibody,quantitative

## 2019-07-15 NOTE — Patient Instructions (Signed)
Take care of yourself Eat a balanced diet and get some exercise when you can  Get outdoors when you can   Labs today

## 2019-07-16 ENCOUNTER — Encounter: Payer: Self-pay | Admitting: *Deleted

## 2019-07-17 LAB — QUANTIFERON-TB GOLD PLUS
Mitogen-NIL: 7.22 IU/mL
NIL: 0.03 IU/mL
QuantiFERON-TB Gold Plus: NEGATIVE
TB1-NIL: 0 IU/mL
TB2-NIL: <0 IU/mL

## 2019-07-17 LAB — VARICELLA ZOSTER ANTIBODY, IGG: Varicella IgG: 240.9 index

## 2019-07-17 LAB — MEASLES/MUMPS/RUBELLA IMMUNITY
Mumps IgG: 156 AU/mL
Rubella: <0.9 index — ABNORMAL LOW
Rubeola IgG: >300 AU/mL

## 2019-07-17 LAB — HEPATITIS C ANTIBODY
Hepatitis C Ab: NONREACTIVE
SIGNAL TO CUT-OFF: 0.14 (ref <1.00)

## 2019-07-17 LAB — HEPATITIS B SURFACE ANTIBODY, QUANTITATIVE: Hep B S AB Quant (Post): >1000 m[IU]/mL (ref >=10)

## 2019-07-20 ENCOUNTER — Telehealth: Payer: Self-pay | Admitting: *Deleted

## 2019-07-20 NOTE — Telephone Encounter (Signed)
Left VM requesting pt to call the office back regarding lab results  

## 2019-07-20 NOTE — Telephone Encounter (Signed)
Addressed through result notes  

## 2019-07-22 ENCOUNTER — Ambulatory Visit (INDEPENDENT_AMBULATORY_CARE_PROVIDER_SITE_OTHER): Payer: BLUE CROSS/BLUE SHIELD | Admitting: *Deleted

## 2019-07-22 DIAGNOSIS — Z23 Encounter for immunization: Secondary | ICD-10-CM

## 2019-07-29 DIAGNOSIS — Z800 Family history of malignant neoplasm of digestive organs: Secondary | ICD-10-CM | POA: Diagnosis not present

## 2019-07-29 DIAGNOSIS — N760 Acute vaginitis: Secondary | ICD-10-CM | POA: Diagnosis not present

## 2019-07-29 DIAGNOSIS — Z6825 Body mass index (BMI) 25.0-25.9, adult: Secondary | ICD-10-CM | POA: Diagnosis not present

## 2019-07-29 DIAGNOSIS — Z01419 Encounter for gynecological examination (general) (routine) without abnormal findings: Secondary | ICD-10-CM | POA: Diagnosis not present

## 2019-07-29 DIAGNOSIS — Z113 Encounter for screening for infections with a predominantly sexual mode of transmission: Secondary | ICD-10-CM | POA: Diagnosis not present

## 2019-09-06 DIAGNOSIS — Z809 Family history of malignant neoplasm, unspecified: Secondary | ICD-10-CM | POA: Diagnosis not present

## 2019-09-06 DIAGNOSIS — Z9189 Other specified personal risk factors, not elsewhere classified: Secondary | ICD-10-CM | POA: Diagnosis not present

## 2019-11-19 ENCOUNTER — Telehealth: Payer: Self-pay

## 2019-11-19 NOTE — Telephone Encounter (Signed)
Patient contacted the office and she states she is needing her rvaccination records with her Hep B vaccine and Varicella vaccine - and her results where it stated where her Hep B levels were low, which is why she needed another vaccine. Shapale, can you help with this?

## 2019-11-19 NOTE — Telephone Encounter (Signed)
Pt notified copy of labs and immunizations ready for pick up

## 2019-11-25 ENCOUNTER — Telehealth: Payer: Self-pay | Admitting: Family Medicine

## 2019-11-25 NOTE — Telephone Encounter (Signed)
Patient is needing her Hep Vaccine for school. She wanted to know if there is anyway possible she could get this done on either a Monday or Friday due to her being in clinicals tues, wed and Thursdays.

## 2019-11-26 NOTE — Telephone Encounter (Signed)
Patient is scheduled for Monday 2/8 to received her Hep b vaccine for school.  Just wanted to make you aware and make sure that this would be okay

## 2019-11-26 NOTE — Telephone Encounter (Signed)
Left VM asking pt Dr. Royden Purl question

## 2019-11-26 NOTE — Telephone Encounter (Signed)
Yes, just let me know which day and time she wants to come. I would prefer that it be some time between 9-11 or 230-4 either day. Just let me know and I will open a spot.   Thanks

## 2019-11-26 NOTE — Telephone Encounter (Signed)
Labs looked like she had immunity to hep B - is it still required by the school? If so, that is ok

## 2019-11-29 ENCOUNTER — Ambulatory Visit (INDEPENDENT_AMBULATORY_CARE_PROVIDER_SITE_OTHER): Payer: BC Managed Care – PPO

## 2019-11-29 ENCOUNTER — Other Ambulatory Visit: Payer: Self-pay

## 2019-11-29 DIAGNOSIS — Z23 Encounter for immunization: Secondary | ICD-10-CM | POA: Diagnosis not present

## 2019-11-29 NOTE — Progress Notes (Signed)
Per pt, she needs vaccine for grad school even though she has shown immunity to Hep B. Per orders of Dr. Milinda Antis, injection of Hepatitis B vaccine given by Sherrie George. Patient tolerated injection well.

## 2019-12-02 ENCOUNTER — Ambulatory Visit: Payer: Self-pay

## 2019-12-17 ENCOUNTER — Telehealth: Payer: Self-pay | Admitting: Family Medicine

## 2019-12-17 NOTE — Telephone Encounter (Signed)
Patient scheduled nurse visit on 12/20/19 at 9:00.

## 2019-12-17 NOTE — Telephone Encounter (Signed)
Pt called wanting to schedule hep b vaccines.  She stated [employer] is requiring them to have this for grad school.  She is in the FNP program.  Also she wants to know if she can get this on Monday or Friday due to work and school schedule

## 2019-12-17 NOTE — Telephone Encounter (Signed)
They do require re-immunization.  Patient said the only day she doesn't have clinicals is on Monday.  Shapale, could you do a nurse visit at 9:00 on Monday?

## 2019-12-17 NOTE — Telephone Encounter (Signed)
Her titer this fall showed immunity.  If they still require re-immunization please schedule her

## 2019-12-17 NOTE — Telephone Encounter (Signed)
Yes that's fine 

## 2019-12-20 ENCOUNTER — Ambulatory Visit (INDEPENDENT_AMBULATORY_CARE_PROVIDER_SITE_OTHER): Payer: BC Managed Care – PPO | Admitting: *Deleted

## 2019-12-20 ENCOUNTER — Other Ambulatory Visit: Payer: Self-pay

## 2019-12-20 ENCOUNTER — Telehealth: Payer: Self-pay | Admitting: *Deleted

## 2019-12-20 DIAGNOSIS — Z23 Encounter for immunization: Secondary | ICD-10-CM | POA: Diagnosis not present

## 2019-12-20 NOTE — Telephone Encounter (Signed)
Second Hep B should be one month from the first and the third Hep B should be 6 months from the first vaccine.

## 2019-12-20 NOTE — Telephone Encounter (Signed)
If her program demands a full series- given her immunity and the fact that she has had the series before- one shot should suffice   It will depend on the demands of her program

## 2019-12-20 NOTE — Telephone Encounter (Signed)
Pt notified and she will call back and schedule her last Hep B in Sept, her schooling is requiring her to have the full series even though she is immune to Hep B, pt will schedule vaccine in Sept once it gets closer to that month

## 2019-12-20 NOTE — Telephone Encounter (Signed)
Pt was here for a nurse visit to get her 2nd Hep B vaccine, pt is asking when does she need to get her 3rd an final Hep B, will send to PCP and also Carollee Herter to see if she can advise

## 2020-01-05 ENCOUNTER — Ambulatory Visit: Payer: BC Managed Care – PPO | Attending: Internal Medicine

## 2020-01-05 DIAGNOSIS — Z20822 Contact with and (suspected) exposure to covid-19: Secondary | ICD-10-CM | POA: Insufficient documentation

## 2020-01-06 LAB — NOVEL CORONAVIRUS, NAA: SARS-CoV-2, NAA: NOT DETECTED

## 2020-02-04 ENCOUNTER — Telehealth: Payer: Self-pay | Admitting: Family Medicine

## 2020-02-04 NOTE — Telephone Encounter (Signed)
That is fine  Please schedule nurse visit for that

## 2020-02-04 NOTE — Telephone Encounter (Signed)
Patient is going to start schooling,.  She stated she is due for a Tdap.  Okay to schedule?

## 2020-02-07 ENCOUNTER — Ambulatory Visit (INDEPENDENT_AMBULATORY_CARE_PROVIDER_SITE_OTHER): Payer: Self-pay | Admitting: *Deleted

## 2020-02-07 DIAGNOSIS — Z23 Encounter for immunization: Secondary | ICD-10-CM

## 2020-02-07 NOTE — Telephone Encounter (Signed)
Patient scheduled appointment for today at 2:30. 

## 2020-02-07 NOTE — Telephone Encounter (Signed)
yes

## 2020-02-07 NOTE — Telephone Encounter (Signed)
Patient can only come on Mondays.  Can you do the shot?

## 2020-04-25 ENCOUNTER — Ambulatory Visit: Payer: BC Managed Care – PPO

## 2020-04-26 ENCOUNTER — Telehealth: Payer: Self-pay

## 2020-04-26 DIAGNOSIS — U071 COVID-19: Secondary | ICD-10-CM | POA: Diagnosis not present

## 2020-04-26 DIAGNOSIS — R918 Other nonspecific abnormal finding of lung field: Secondary | ICD-10-CM | POA: Diagnosis not present

## 2020-04-26 DIAGNOSIS — J1282 Pneumonia due to coronavirus disease 2019: Secondary | ICD-10-CM | POA: Diagnosis not present

## 2020-04-26 NOTE — Telephone Encounter (Signed)
Pt said tested + for covid on 04/22/20 at  [facility] in [place]. pts fever is 102 -104. Pt is taking tylenol and not really relieving fever, Employee health has been following pt at [facility] and are concerned could be more than covid going on. Pt has bilateral flank pain, chills, muscle aches, generalized malaise with severe H/A. Dry cough,but no CP or SOB. No abd pain or diarrhea. No loss of taste or smell. Pt is fatigued. On Mon vomited and could not keep fluids down, nausea on 04/25/20.today pt can keep fluids down but not well; pt has dry mouth and dizziness. Pt will go to [facility] ED since that is her employer for eval and any needed testing. FYI to Dr Milinda Antis.

## 2020-04-26 NOTE — Telephone Encounter (Signed)
Agree with that advisement -needs to be seen in ER Will watch for correspondence

## 2020-04-27 ENCOUNTER — Emergency Department (HOSPITAL_COMMUNITY)
Admission: EM | Admit: 2020-04-27 | Discharge: 2020-04-27 | Disposition: A | Discharge status: Home / Self Care | Payer: BC Managed Care – PPO | Attending: Emergency Medicine | Admitting: Emergency Medicine

## 2020-04-27 ENCOUNTER — Other Ambulatory Visit: Payer: Self-pay

## 2020-04-27 ENCOUNTER — Encounter (HOSPITAL_COMMUNITY): Payer: Self-pay | Admitting: *Deleted

## 2020-04-27 DIAGNOSIS — R509 Fever, unspecified: Secondary | ICD-10-CM | POA: Diagnosis not present

## 2020-04-27 DIAGNOSIS — U071 COVID-19: Secondary | ICD-10-CM | POA: Insufficient documentation

## 2020-04-27 DIAGNOSIS — R0602 Shortness of breath: Secondary | ICD-10-CM | POA: Insufficient documentation

## 2020-04-27 DIAGNOSIS — Z5321 Procedure and treatment not carried out due to patient leaving prior to being seen by health care provider: Secondary | ICD-10-CM | POA: Insufficient documentation

## 2020-04-27 LAB — CBC
HCT: 40.6 % (ref 36.0–46.0)
Hemoglobin: 13.3 g/dL (ref 12.0–15.0)
MCH: 24 pg — ABNORMAL LOW (ref 26.0–34.0)
MCHC: 32.8 g/dL (ref 30.0–36.0)
MCV: 73.3 fL — ABNORMAL LOW (ref 80.0–100.0)
Platelets: 201 10*3/uL (ref 150–400)
RBC: 5.54 MIL/uL — ABNORMAL HIGH (ref 3.87–5.11)
RDW: 13.1 % (ref 11.5–15.5)
WBC: 3.8 10*3/uL — ABNORMAL LOW (ref 4.0–10.5)
nRBC: 0 % (ref 0.0–0.2)

## 2020-04-27 LAB — BASIC METABOLIC PANEL
Anion gap: 11 (ref 5–15)
BUN: 6 mg/dL (ref 6–20)
CO2: 26 mmol/L (ref 22–32)
Calcium: 8.3 mg/dL — ABNORMAL LOW (ref 8.9–10.3)
Chloride: 99 mmol/L (ref 98–111)
Creatinine, Ser: 0.89 mg/dL (ref 0.44–1.00)
GFR calc Af Amer: >60 mL/min (ref >60)
GFR calc non Af Amer: >60 mL/min (ref >60)
Glucose, Bld: 115 mg/dL — ABNORMAL HIGH (ref 70–99)
Potassium: 3.4 mmol/L — ABNORMAL LOW (ref 3.5–5.1)
Sodium: 136 mmol/L (ref 135–145)

## 2020-04-27 NOTE — ED Triage Notes (Signed)
Pt reports she has had a temp (tmax 103, last tylenol at midnight), increased shortness of breath, dry cough, nausea, dizziness. + Covid on Wednesday in [place]. Seen at [facility] yesterday, said she was feeling better, but now feeling worse again.

## 2020-04-27 NOTE — ED Notes (Signed)
Pt Stated to Registration Staff she is leaving.

## 2020-04-27 NOTE — ED Triage Notes (Signed)
Pt arrives via [facility], per report tested positive for COVID on Wednesday. She is c/o some shortness of breath and generalized weakness. Seen at [facility] yesterday, rec'd fluids. Felt better, but not feeling good again. Lung sounds clear, 97% RA. 102/64, P 90, 97.7 temp.

## 2020-05-08 ENCOUNTER — Ambulatory Visit
Admission: RE | Admit: 2020-05-08 | Discharge: 2020-05-08 | Disposition: A | Discharge status: Home / Self Care | Payer: BC Managed Care – PPO | Source: Ambulatory Visit | Attending: Family Medicine | Admitting: Family Medicine

## 2020-05-08 ENCOUNTER — Ambulatory Visit
Admission: EM | Admit: 2020-05-08 | Discharge: 2020-05-08 | Disposition: A | Discharge status: Home / Self Care | Payer: BC Managed Care – PPO | Attending: Family Medicine | Admitting: Family Medicine

## 2020-05-08 ENCOUNTER — Ambulatory Visit
Admission: RE | Admit: 2020-05-08 | Discharge: 2020-05-08 | Disposition: A | Discharge status: Home / Self Care | Payer: BC Managed Care – PPO | Attending: Family Medicine | Admitting: Family Medicine

## 2020-05-08 ENCOUNTER — Other Ambulatory Visit: Payer: Self-pay

## 2020-05-08 DIAGNOSIS — R0902 Hypoxemia: Secondary | ICD-10-CM

## 2020-05-08 DIAGNOSIS — R000 Tachycardia, unspecified: Secondary | ICD-10-CM | POA: Diagnosis not present

## 2020-05-08 DIAGNOSIS — Z8616 Personal history of COVID-19: Secondary | ICD-10-CM | POA: Diagnosis not present

## 2020-05-08 DIAGNOSIS — R0602 Shortness of breath: Secondary | ICD-10-CM | POA: Diagnosis not present

## 2020-05-08 DIAGNOSIS — R042 Hemoptysis: Secondary | ICD-10-CM

## 2020-05-08 DIAGNOSIS — J90 Pleural effusion, not elsewhere classified: Secondary | ICD-10-CM | POA: Diagnosis not present

## 2020-05-08 DIAGNOSIS — R059 Cough, unspecified: Secondary | ICD-10-CM

## 2020-05-08 MED ORDER — PREDNISONE 10 MG (21) PO TBPK
ORAL_TABLET | ORAL | 0 refills | Status: DC
Start: 2020-05-08 — End: 2020-07-04

## 2020-05-08 MED ORDER — ALBUTEROL SULFATE HFA 108 (90 BASE) MCG/ACT IN AERS
2.0000 | INHALATION_SPRAY | RESPIRATORY_TRACT | 0 refills | Status: DC | PRN
Start: 2020-05-08 — End: 2020-07-04

## 2020-05-08 MED ORDER — FLUTICASONE FUROATE-VILANTEROL 200-25 MCG/INH IN AEPB: 1.0000 | INHALATION_SPRAY | Freq: Every day | RESPIRATORY_TRACT | 0 refills | Status: AC

## 2020-05-08 NOTE — ED Triage Notes (Signed)
Pt reports being tested covid + July 3. Reports covid symptoms relieved other than the cough. "I think I have been coughing so much/hard has caused the pain in my back. Reports coughing up blood tinged sputum.

## 2020-05-08 NOTE — Discharge Instructions (Signed)
Go directly to outpatient imaging center for chest x-ray   I will call you as soon as I see results  If your chest x-ray is abnormal, I want you to go to the ER directly

## 2020-05-08 NOTE — ED Provider Notes (Signed)
[facility]   [id number] 05/08/20 Arrival Time: 1613   CC: COVID symptoms  SUBJECTIVE: History from: patient.  Natalie Kelley is a 39 y.o. female who presents with gradual onset of shortness of breath that has gotten progressively worse over the last 24 hours.  Reports that she was diagnosed with Covid on 04/22/2020.  Was seen in [facility] ER at 04/25/2020. Was transported to [facility] ER via EMS on 04/27/2020 for shortness of breath.  Reports that she was never seen.  Reports that she has been at home, reports that she has not received any medications prescription or otherwise. Reports that she has been trying to stay hydrated.  Also reports that she has been having hemoptysis for the last day and a half. Reports that she does not have a way to check her oxygen saturation at home. There are no alleviating symptoms.  Symptoms are aggravated by activity and speaking full sentences. Denies fever, chills, fatigue, sinus pain, rhinorrhea, sore throat, wheezing, chest pain, nausea, changes in bowel or bladder habits.    ROS: As per HPI.  All other pertinent ROS negative.     Past Medical History:  Diagnosis Date  . Abnormal pap 2009  . Ectopic pregnancy   . Miscarriage within last 12 months   . No pertinent past medical history    Past Surgical History:  Procedure Laterality Date  . CESAREAN SECTION N/A 01/23/2016   Procedure: CESAREAN SECTION;  Surgeon: Richarda Overlie, MD;  Location: [facility];  Service: Obstetrics;  Laterality: N/A;  primary edc 03/07/16   . CRYOTHERAPY  2009  . DILATION AND EVACUATION N/A 10/12/2014   Procedure: DILATATION AND EVACUATION;  Surgeon: Meriel Pica, MD;  Location: [facility];  Service: Gynecology;  Laterality: N/A;   Allergies  Allergen Reactions  . Amoxapine And Related Hives  . Aspirin Hives    Pt is able to take ibuprofen without problems.  . Penicillins Hives    Has patient had a PCN reaction causing immediate rash, facial/tongue/throat swelling, SOB or  lightheadedness with hypotension: No Has patient had a PCN reaction causing severe rash involving mucus membranes or skin necrosis: No Has patient had a PCN reaction that required hospitalization No Has patient had a PCN reaction occurring within the last 10 years: Yes If all of the above answers are "NO", then may proceed with Cephalosporin use.   . Sulfonamide Derivatives Hives and Other (See Comments)    fever  . Terbutaline Hives    Skin discoloration and skin peeling   No current facility-administered medications on file prior to encounter.   No current outpatient medications on file prior to encounter.   Social History   Socioeconomic History  . Marital status: Married    Spouse name: Not on file  . Number of children: 2  . Years of education: Not on file  . Highest education level: Not on file  Occupational History  . Occupation: oncology nurse  Tobacco Use  . Smoking status: Former Smoker    Packs/day: 0.50    Years: 3.00    Pack years: 1.50    Types: Cigarettes  . Smokeless tobacco: Never Used  Substance and Sexual Activity  . Alcohol use: No    Alcohol/week: 0.0 standard drinks  . Drug use: No  . Sexual activity: Yes  Other Topics Concern  . Not on file  Social History Narrative  . Not on file   Social Determinants of Health   Financial Resource Strain:   .  Difficulty of Paying Living Expenses:   Food Insecurity:   . Worried About Programme researcher, broadcasting/film/video in the Last Year:   . Barista in the Last Year:   Transportation Needs:   . Freight forwarder (Medical):   Marland Kitchen Lack of Transportation (Non-Medical):   Physical Activity:   . Days of Exercise per Week:   . Minutes of Exercise per Session:   Stress:   . Feeling of Stress :   Social Connections:   . Frequency of Communication with Friends and Family:   . Frequency of Social Gatherings with Friends and Family:   . Attends Religious Services:   . Active Member of Clubs or Organizations:   .  Attends Banker Meetings:   Marland Kitchen Marital Status:   Intimate Partner Violence:   . Fear of Current or Ex-Partner:   . Emotionally Abused:   Marland Kitchen Physically Abused:   . Sexually Abused:    Family History  Problem Relation Age of Onset  . Colon cancer Father   . Esophageal varices Father   . Hypertension Father   . Stroke Father   . Atrial fibrillation Mother   . Autism Son     OBJECTIVE:  Vitals:   05/08/20 1624 05/08/20 1625  BP: 115/80   Pulse: (!) 122   Resp: 18   Temp: 98.9 F (37.2 C)   TempSrc: Oral   SpO2: 92%   Weight:  132 lb (59.9 kg)  Height:  4\' 11"  (1.499 m)     General appearance: alert; appears fatigued, but nontoxic; short of breath, labored when speaking in full sentences, tolerating secretions  HEENT: NCAT; Ears: EACs clear, TMs pearly gray; Eyes: PERRL.  EOM grossly intact. Sinuses: nontender; Nose: nares patent without rhinorrhea, Throat: oropharynx clear, tonsils non erythematous or enlarged, uvula midline  Neck: supple without LAD Lungs: Shallow breathing, diminished lung sounds to bilateral bases, more diminished on the right than the left Heart: Tachycardic, regular rhythm.  Radial pulses 2+ symmetrical bilaterally Skin: warm and dry Psychological: alert and cooperative; normal mood and affect  LABS:  No results found for this or any previous visit (from the past 24 hour(s)).   ASSESSMENT & PLAN:  1. Tachycardia   2. Hypoxia   3. Shortness of breath   4. Hemoptysis   5. Cough   6. History of COVID-19     Meds ordered this encounter  Medications  . predniSONE (STERAPRED UNI-PAK 21 TAB) 10 MG (21) TBPK tablet    Sig: Take 6 tabs by mouth daily  for 2 days, then 5 tabs for 2 days, then 4 tabs for 2 days, then 3 tabs for 2 days, 2 tabs for 2 days, then 1 tab by mouth daily for 2 days    Dispense:  42 tablet    Refill:  0    Order Specific Question:   Supervising Provider    Answer:   Merrilee Jansky  . albuterol  (VENTOLIN HFA) 108 (90 Base) MCG/ACT inhaler    Sig: Inhale 2 puffs into the lungs every 4 (four) hours as needed for wheezing or shortness of breath.    Dispense:  18 g    Refill:  0    Order Specific Question:   Supervising Provider    Answer:   [id number] Merrilee Jansky  . fluticasone furoate-vilanterol (BREO ELLIPTA) 200-25 MCG/INH AEPB    Sig: Inhale 1 puff into the lungs daily.    Dispense:  28 each    Refill:  0    Order Specific Question:   Supervising Provider    Answer:   LAMPTEY, PHILIP O [[id number]]   O2 sats 90% room air placed on 3 L oxygen nasal cannula, O2 sats up to 98% Declines going to the ER at this time Discussed with patient that she would be best served in the emergency room Cannot rule out pneumothorax, PE, other life-threatening causes of shortness of breath in urgent care She agrees and understands, really does not wish to go to the ER Is agreeable to outpatient x-ray, and if x-ray is abnormal states that she will go to the ER X-ray shows bilateral multifocal opacities consistent with viral pneumonia Prescribe steroids Prescribed albuterol Prescription steroid inhaler Use OTC medications like ibuprofen or tylenol as needed fever or pain Call or go to the ED if you have any new or worsening symptoms such as fever, worsening cough, shortness of breath, chest tightness, chest pain, turning blue, changes in mental status.  Reviewed expectations re: course of current medical issues. Questions answered. Outlined signs and symptoms indicating need for more acute intervention. Patient verbalized understanding. After Visit Summary given.         Moshe Cipro, NP 05/08/20 1749

## 2020-05-08 NOTE — ED Notes (Signed)
3L's O2 placed on pt per NP. O2 98%.

## 2020-05-16 ENCOUNTER — Ambulatory Visit: Payer: BC Managed Care – PPO | Attending: Internal Medicine

## 2020-05-16 DIAGNOSIS — Z23 Encounter for immunization: Secondary | ICD-10-CM

## 2020-05-16 NOTE — Progress Notes (Signed)
   Covid-19 Vaccination Clinic  Name:  Natalie Kelley    MRN: [id number] DOB: May 28, 1981  05/16/2020  [name] was observed post Covid-19 immunization for 15 minutes without incident. She was provided with Vaccine Information Sheet and instruction to access the V-Safe system.   [name] was instructed to call 911 with any severe reactions post vaccine: Marland Kitchen Difficulty breathing  . Swelling of face and throat  . A fast heartbeat  . A bad rash all over body  . Dizziness and weakness   Immunizations Administered    Name Date Dose VIS Date Route   Pfizer COVID-19 Vaccine 05/16/2020  9:34 AM 0.3 mL 12/15/2018 Intramuscular   Manufacturer: [employer], [employer]   Lot: [id number]   NDC: [address]-8

## 2020-05-30 ENCOUNTER — Telehealth: Payer: Self-pay | Admitting: Family Medicine

## 2020-05-30 ENCOUNTER — Ambulatory Visit: Payer: BC Managed Care – PPO

## 2020-05-30 NOTE — Telephone Encounter (Signed)
Patient have been schedule for nurse visit on 05/30/2020 for her 3rd Hep B.  However, patient have called the office and waiting in the parking lot due her son exposed to Covid. I have spoken to patient and was told that her son has a fever last night. He was exposed to a family member who tested positive for Covid on Friday. Her son return home yesterday. 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 Patient may try to get a rapid test for covid or go to urgent care if they can do both for patient.  Patient verbalized understanding and will discuss with school.

## 2020-06-12 ENCOUNTER — Ambulatory Visit: Payer: BC Managed Care – PPO | Attending: Internal Medicine

## 2020-06-12 ENCOUNTER — Ambulatory Visit: Payer: BC Managed Care – PPO

## 2020-06-12 ENCOUNTER — Telehealth: Payer: Self-pay | Admitting: Family Medicine

## 2020-06-12 DIAGNOSIS — Z23 Encounter for immunization: Secondary | ICD-10-CM

## 2020-06-12 NOTE — Telephone Encounter (Signed)
Patient called in inquiring if PCP will order the hep B immunization for her and send it to pharmacy so she can self administer. Please advise if able to do.

## 2020-06-12 NOTE — Progress Notes (Signed)
   Covid-19 Vaccination Clinic  Name:  Lassie Demorest    MRN: [id number] DOB: [birth year]  06/12/2020  Ms. Edgley was observed post Covid-19 immunization for 15 minutes without incident. She was provided with Vaccine Information Sheet and instruction to access the V-Safe system.   Ms. Coakley was instructed to call 911 with any severe reactions post vaccine: Marland Kitchen Difficulty breathing  . Swelling of face and throat  . A fast heartbeat  . A bad rash all over body  . Dizziness and weakness   Immunizations Administered    Name Date Dose VIS Date Route   Pfizer COVID-19 Vaccine 06/12/2020  2:42 PM 0.3 mL 12/15/2018 Intramuscular   Manufacturer: [employer], [employer]   Lot: [id number]   NDC: [address]-2

## 2020-06-13 NOTE — Telephone Encounter (Signed)
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  She may want to check on price and if pharmacy can get it also  Let me know

## 2020-06-13 NOTE — Telephone Encounter (Signed)
It's an IM injection. Called pt and no answer so left VM requesting pt to call the office back

## 2020-06-16 MED ORDER — RECOMBIVAX HB 10 MCG/ML IJ SUSP: 1.0000 mL | Freq: Once | INTRAMUSCULAR | 0 refills | Status: AC

## 2020-06-16 NOTE — Telephone Encounter (Signed)
I sent it - I hope this was the correct way (have not ordered before and this is what I could find in epic)

## 2020-06-16 NOTE — Telephone Encounter (Signed)
Ordered for "in house admin" order cancelled and Rx sent

## 2020-06-16 NOTE — Telephone Encounter (Signed)
Pt said she will get pharmacist to give her the inj. Please send to [facility]

## 2020-06-19 ENCOUNTER — Telehealth: Payer: Self-pay | Admitting: Family Medicine

## 2020-06-19 NOTE — Telephone Encounter (Signed)
Patient called to schedule Hep B shot.  Patient said it's for her 2nd shot. According to her records, she already had 2 shots and this should be her third shot. Patient insists she only had 1 shot. Patient said she has to have the shot by this Wednesday or she won't be able to to clinicals. There are no nurse visits available until 06/29/20. Patient has tried to go to urgent care,pharmacy, and other [facility] to schedule appointment. Please advise.

## 2020-06-19 NOTE — Telephone Encounter (Signed)
Will route to [facility] to see if pt can be worked in

## 2020-06-21 NOTE — Telephone Encounter (Signed)
LVM. Pt is requesting a NV today for Hep B vaccine. She can be placed on the schedule for this afternoon if she calls back.

## 2020-06-22 ENCOUNTER — Ambulatory Visit (INDEPENDENT_AMBULATORY_CARE_PROVIDER_SITE_OTHER): Payer: BC Managed Care – PPO

## 2020-06-22 ENCOUNTER — Other Ambulatory Visit: Payer: Self-pay

## 2020-06-22 DIAGNOSIS — Z23 Encounter for immunization: Secondary | ICD-10-CM

## 2020-07-04 ENCOUNTER — Encounter: Payer: Self-pay | Admitting: Family Medicine

## 2020-07-04 ENCOUNTER — Other Ambulatory Visit: Payer: Self-pay

## 2020-07-04 ENCOUNTER — Ambulatory Visit (INDEPENDENT_AMBULATORY_CARE_PROVIDER_SITE_OTHER): Payer: BC Managed Care – PPO | Admitting: Family Medicine

## 2020-07-04 VITALS — BP 102/70 | HR 71 | Temp 96.8°F | Ht 59.0 in | Wt 125.3 lb

## 2020-07-04 DIAGNOSIS — Z0000 Encounter for general adult medical examination without abnormal findings: Secondary | ICD-10-CM

## 2020-07-04 DIAGNOSIS — Z23 Encounter for immunization: Secondary | ICD-10-CM | POA: Diagnosis not present

## 2020-07-04 DIAGNOSIS — Z111 Encounter for screening for respiratory tuberculosis: Secondary | ICD-10-CM | POA: Diagnosis not present

## 2020-07-04 LAB — LIPID PANEL
Cholesterol: 189 mg/dL (ref 0–200)
HDL: 49.5 mg/dL (ref >39.00)
LDL Cholesterol: 121 mg/dL — ABNORMAL HIGH (ref 0–99)
NonHDL: 139.34
Total CHOL/HDL Ratio: 4
Triglycerides: 93 mg/dL (ref 0.0–149.0)
VLDL: 18.6 mg/dL (ref 0.0–40.0)

## 2020-07-04 LAB — CBC WITH DIFFERENTIAL/PLATELET
Basophils Absolute: 0 10*3/uL (ref 0.0–0.1)
Basophils Relative: 0.6 % (ref 0.0–3.0)
Eosinophils Absolute: 0.1 10*3/uL (ref 0.0–0.7)
Eosinophils Relative: 1.4 % (ref 0.0–5.0)
HCT: 36.8 % (ref 36.0–46.0)
Hemoglobin: 11.9 g/dL — ABNORMAL LOW (ref 12.0–15.0)
Lymphocytes Relative: 41 % (ref 12.0–46.0)
Lymphs Abs: 2.5 10*3/uL (ref 0.7–4.0)
MCHC: 32.4 g/dL (ref 30.0–36.0)
MCV: 77.2 fl — ABNORMAL LOW (ref 78.0–100.0)
Monocytes Absolute: 0.5 10*3/uL (ref 0.1–1.0)
Monocytes Relative: 7.4 % (ref 3.0–12.0)
Neutro Abs: 3 10*3/uL (ref 1.4–7.7)
Neutrophils Relative %: 49.6 % (ref 43.0–77.0)
Platelets: 312 10*3/uL (ref 150.0–400.0)
RBC: 4.76 Mil/uL (ref 3.87–5.11)
RDW: 16.1 % — ABNORMAL HIGH (ref 11.5–15.5)
WBC: 6.1 10*3/uL (ref 4.0–10.5)

## 2020-07-04 LAB — COMPREHENSIVE METABOLIC PANEL
ALT: 28 U/L (ref 0–35)
AST: 16 U/L (ref 0–37)
Albumin: 3.9 g/dL (ref 3.5–5.2)
Alkaline Phosphatase: 54 U/L (ref 39–117)
BUN: 15 mg/dL (ref 6–23)
CO2: 29 mEq/L (ref 19–32)
Calcium: 9.1 mg/dL (ref 8.4–10.5)
Chloride: 103 mEq/L (ref 96–112)
Creatinine, Ser: 0.78 mg/dL (ref 0.40–1.20)
GFR: 82.59 mL/min (ref >60.00)
Glucose, Bld: 86 mg/dL (ref 70–99)
Potassium: 3.9 mEq/L (ref 3.5–5.1)
Sodium: 138 mEq/L (ref 135–145)
Total Bilirubin: 0.6 mg/dL (ref 0.2–1.2)
Total Protein: 6.9 g/dL (ref 6.0–8.3)

## 2020-07-04 LAB — TSH: TSH: 0.81 u[IU]/mL (ref 0.35–4.50)

## 2020-07-04 NOTE — Assessment & Plan Note (Signed)
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

## 2020-07-04 NOTE — Patient Instructions (Signed)
Take care of yourself   Add exercise when you can  Add a multi vitamin daily if diet is not balanced  If not -just 1000 iu of vitamin D   Labs today  Flu shot today

## 2020-07-04 NOTE — Assessment & Plan Note (Signed)
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

## 2020-07-04 NOTE — Progress Notes (Signed)
Subjective:    Patient ID: Natalie Kelley, female    DOB: 06-06-1981, 38 y.o.   MRN: [id number]  This visit occurred during the SARS-CoV-2 public health emergency.  Safety protocols were in place, including screening questions prior to the visit, additional usage of staff PPE, and extensive cleaning of exam room while observing appropriate contact time as indicated for disinfecting solutions.    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 Cyst or LN  Plans to f/u with gyn   Going to [place] for a wedding soon   Patient Active Problem List   Diagnosis Date Noted  . Screening-pulmonary TB 07/04/2020  . Vasa previa in labor and delivery, antepartum 01/02/2016  . Placental abruption in second trimester 11/29/2015  . Placenta previa 11/27/2015  . Immunity status testing 11/10/2015  . Routine general medical examination at a health care facility 03/13/2015  . Encounter for routine gynecological examination 03/13/2015  . Family history of colon cancer 03/13/2015  . FIBROCYSTIC BREAST  DISEASE 02/05/2007   Past Medical History:  Diagnosis Date  . Abnormal pap 2009  . Ectopic pregnancy   . Miscarriage within last 12 months   . No pertinent past medical history    Past Surgical History:  Procedure Laterality Date  . CESAREAN SECTION N/A 01/23/2016   Procedure: CESAREAN SECTION;  Surgeon: Richarda Overlie, MD;  Location: [facility];  Service: Obstetrics;  Laterality: N/A;  primary edc 03/07/16   . CRYOTHERAPY  2009  . DILATION AND EVACUATION N/A 10/12/2014   Procedure: DILATATION AND EVACUATION;  Surgeon: Meriel Pica, MD;  Location: [facility];  Service: Gynecology;  Laterality: N/A;   Social History   Tobacco Use  . Smoking status: Former Smoker    Packs/day: 0.50    Years: 3.00    Pack years: 1.50    Types: Cigarettes  . Smokeless tobacco: Never Used  Substance Use Topics  . Alcohol use: No    Alcohol/week: 0.0 standard drinks  . Drug use: No   Family History  Problem Relation Age of Onset  . Colon cancer Father   . Esophageal varices Father   . Hypertension Father   . Stroke Father   . Atrial fibrillation Mother   . Autism Son    Allergies  Allergen Reactions  . Amoxapine And Related Hives  . Aspirin Hives  Pt is able to take ibuprofen without problems.  . Penicillins Hives    Has patient had a PCN reaction causing immediate rash, facial/tongue/throat swelling, SOB or lightheadedness with hypotension: No Has patient had a PCN reaction causing severe rash involving mucus membranes or skin necrosis: No Has patient had a PCN reaction that required hospitalization No Has patient had a PCN reaction occurring within the last 10 years: Yes If all of the above answers are "NO", then may proceed with Cephalosporin use.   . Sulfonamide Derivatives Hives and Other (See Comments)    fever  . Terbutaline Hives    Skin discoloration and skin peeling   No current outpatient medications on file prior to visit.   No current facility-administered medications  on file prior to visit.    Review of Systems  Constitutional: Positive for fatigue. Negative for activity change, appetite change, fever and unexpected weight change.       Some fatigue from busy schedule  HENT: Negative for congestion, ear pain, rhinorrhea, sinus pressure and sore throat.   Eyes: Negative for pain, redness and visual disturbance.  Respiratory: Negative for cough, shortness of breath and wheezing.   Cardiovascular: Negative for chest pain and palpitations.  Gastrointestinal: Negative for abdominal pain, blood in stool, constipation and diarrhea.  Endocrine: Negative for polydipsia and polyuria.  Genitourinary: Negative for dysuria, frequency and urgency.  Musculoskeletal: Negative for arthralgias, back pain and myalgias.  Skin: Negative for pallor and rash.  Allergic/Immunologic: Negative for environmental allergies.  Neurological: Negative for dizziness, syncope and headaches.  Hematological: Negative for adenopathy. Does not bruise/bleed easily.  Psychiatric/Behavioral: Positive for sleep disturbance. Negative for decreased concentration and dysphoric mood. The patient is not nervous/anxious.        Stressors- family /work/school Mood is overall ok  Some trouble sleeping       Objective:   Physical Exam Constitutional:      General: She is not in acute distress.    Appearance: Normal appearance. She is well-developed and normal weight. She is not ill-appearing or diaphoretic.  HENT:     Head: Normocephalic and atraumatic.     Right Ear: Tympanic membrane, ear canal and external ear normal.     Left Ear: Tympanic membrane, ear canal and external ear normal.     Nose: Nose normal. No congestion.     Mouth/Throat:     Mouth: Mucous membranes are moist.     Pharynx: Oropharynx is clear. No posterior oropharyngeal erythema.  Eyes:     General: No scleral icterus.    Extraocular Movements: Extraocular movements intact.     Conjunctiva/sclera: Conjunctivae normal.       Pupils: Pupils are equal, round, and reactive to light.  Neck:     Thyroid: No thyromegaly.     Vascular: No carotid bruit or JVD.  Cardiovascular:     Rate and Rhythm: Normal rate and regular rhythm.     Pulses: Normal pulses.     Heart sounds: Normal heart sounds. No gallop.   Pulmonary:     Effort: Pulmonary effort is normal. No respiratory distress.     Breath sounds: Normal breath sounds. No wheezing.     Comments: Good air exch Chest:     Chest wall: No tenderness.  Abdominal:     General: Bowel sounds are normal. There is no distension or abdominal bruit.     Palpations: Abdomen is soft. There is no mass.     Tenderness: There is no abdominal tenderness.  Hernia: No hernia is present.  Genitourinary:    Comments: Breast and pelvic exam done by gyn   Lump in R axillae is fairly superficial but no redness (mildly tender)   Musculoskeletal:        General: No tenderness. Normal range of motion.     Cervical back: Normal range of motion and neck supple. No rigidity. No muscular tenderness.     Right lower leg: No edema.     Left lower leg: No edema.  Lymphadenopathy:     Cervical: No cervical adenopathy.  Skin:    General: Skin is warm and dry.     Coloration: Skin is not pale.     Findings: No erythema or rash.     Comments: Few lentigines   Neurological:     Mental Status: She is alert. Mental status is at baseline.     Cranial Nerves: No cranial nerve deficit.     Motor: No abnormal muscle tone.     Coordination: Coordination normal.     Gait: Gait normal.     Deep Tendon Reflexes: Reflexes are normal and symmetric. Reflexes normal.  Psychiatric:        Mood and Affect: Mood normal.        Cognition and Memory: Cognition and memory normal.     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

## 2020-07-06 LAB — QUANTIFERON-TB GOLD PLUS
Mitogen-NIL: 9.83 IU/mL
NIL: 0.03 IU/mL
QuantiFERON-TB Gold Plus: NEGATIVE
TB1-NIL: 0.01 IU/mL
TB2-NIL: 0 IU/mL

## 2020-07-13 ENCOUNTER — Encounter: Payer: Self-pay | Admitting: *Deleted

## 2020-08-15 ENCOUNTER — Telehealth: Payer: Self-pay | Admitting: Family Medicine

## 2020-08-15 DIAGNOSIS — Z0184 Encounter for antibody response examination: Secondary | ICD-10-CM

## 2020-08-15 NOTE — Telephone Encounter (Signed)
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

## 2020-08-15 NOTE — Telephone Encounter (Signed)
Will route to [facility] to make sure test is correct and will route to [facility] to schedule non fasting lab appt

## 2020-08-15 NOTE — Telephone Encounter (Signed)
Test order is correct

## 2020-08-15 NOTE — Telephone Encounter (Signed)
Test ordered Please let me know if this is not the correct order  Please set up lab appt

## 2020-08-16 ENCOUNTER — Other Ambulatory Visit (INDEPENDENT_AMBULATORY_CARE_PROVIDER_SITE_OTHER): Payer: BC Managed Care – PPO

## 2020-08-16 ENCOUNTER — Other Ambulatory Visit: Payer: Self-pay

## 2020-08-16 DIAGNOSIS — Z0184 Encounter for antibody response examination: Secondary | ICD-10-CM

## 2020-08-16 NOTE — Telephone Encounter (Signed)
Patient scheduled appointment for today at 2:30.

## 2020-08-17 LAB — HEPATITIS B SURFACE ANTIBODY, QUANTITATIVE: Hep B S AB Quant (Post): >1000 m[IU]/mL (ref >=10)

## 2020-12-08 ENCOUNTER — Telehealth: Payer: Self-pay

## 2020-12-08 NOTE — Telephone Encounter (Signed)
Pt is coming by today to pick up hep C antibody, Quantiferon TB gold MMR and varicella zoster antibody from 07/15/19 and Hep B from 10/27/21and quantiferon TB gold from 07/14/20 along with immunization record. Done and at front desk for pick up and pt is aware and will pick up dater today.Nothing else needed at this time.

## 2020-12-28 ENCOUNTER — Telehealth: Payer: Self-pay

## 2020-12-28 NOTE — Telephone Encounter (Signed)
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 She is needing a note stating when her CPE was and that she is free of any communicable diseases. She is aware Dr Milinda Antis is out of the office. I will get with another provider to see if this can be done before tomorrow afternoon. She will pick it up.   Will send to Mayra Reel, NP to see if she will approve this to be done.

## 2020-12-28 NOTE — Telephone Encounter (Signed)
Note completed and is ready for pick up at her convenience.

## 2021-01-10 ENCOUNTER — Telehealth: Payer: Self-pay

## 2021-01-10 NOTE — Telephone Encounter (Signed)
Type of forms received:physical form  Routed FM:[id number]   Paperwork received by Marilynne Drivers requesting form]: Hayner,Iyanni   Individual made aware of 3-5 business day turn around (Y/N): y  Faxed to :   Form location:   Patient knows it may take the entire 5-6 days patient requesting to pick form up by Friday to start the position on Monday.   Please call and advise

## 2021-01-10 NOTE — Telephone Encounter (Signed)
CPE was 07/04/21, form placed in your inbox for review

## 2021-01-11 ENCOUNTER — Telehealth: Payer: Self-pay

## 2021-01-11 NOTE — Telephone Encounter (Signed)
Pt notified form ready for pick-up and copy sent to scanning 

## 2021-01-11 NOTE — Telephone Encounter (Signed)
Done and in IN box 

## 2021-01-11 NOTE — Telephone Encounter (Signed)
Pt left v/m that she is starting a new job on 01/15/21 and needs copy of immunization record; please call pt when ready for pick up.

## 2021-01-11 NOTE — Telephone Encounter (Signed)
Printed, placed at front for pick up and pt notified

## 2021-02-23 ENCOUNTER — Other Ambulatory Visit: Payer: Self-pay

## 2021-02-23 ENCOUNTER — Ambulatory Visit
Admission: EM | Admit: 2021-02-23 | Discharge: 2021-02-23 | Disposition: A | Discharge status: Home / Self Care | Payer: No Typology Code available for payment source | Attending: Emergency Medicine | Admitting: Emergency Medicine

## 2021-02-23 DIAGNOSIS — N390 Urinary tract infection, site not specified: Secondary | ICD-10-CM

## 2021-02-23 DIAGNOSIS — R319 Hematuria, unspecified: Secondary | ICD-10-CM

## 2021-02-23 LAB — POCT URINALYSIS DIP (MANUAL ENTRY)
Bilirubin, UA: NEGATIVE
Glucose, UA: 100 mg/dL — AB
Nitrite, UA: POSITIVE — AB
Protein Ur, POC: 100 mg/dL — AB
Spec Grav, UA: 1.025 (ref 1.010–1.025)
Urobilinogen, UA: 1 E.U./dL
pH, UA: 5 (ref 5.0–8.0)

## 2021-02-23 MED ORDER — NITROFURANTOIN MONOHYD MACRO 100 MG PO CAPS
100.0000 mg | ORAL_CAPSULE | Freq: Two times a day (BID) | ORAL | 0 refills | Status: DC
Start: 2021-02-23 — End: 2021-06-06

## 2021-02-23 NOTE — ED Triage Notes (Signed)
Pt reports having urinary urgency, frequency and dysuria that began yesterday. sts she started using AZO yesterday.

## 2021-02-23 NOTE — ED Provider Notes (Signed)
Natalie Kelley    CSN: [id number] Arrival date & time: 02/23/21  1315      History   Chief Complaint Chief Complaint  Patient presents with  . Urinary Frequency    HPI Natalie Kelley is a 40 y.o. female.   Patient presents with 1 day history of dysuria, frequency, urgency.  Treatment at home with Azo.  She denies fever, chills, rash, abdominal pain, pelvic pain, vaginal discharge, or other symptoms.  No pertinent medical history.     The history is provided by the patient and medical records.    Past Medical History:  Diagnosis Date  . Abnormal pap 2009  . Ectopic pregnancy   . Miscarriage within last 12 months   . No pertinent past medical history     Patient Active Problem List   Diagnosis Date Noted  . Screening-pulmonary TB 07/04/2020  . Vasa previa in labor and delivery, antepartum 01/02/2016  . Placental abruption in second trimester 11/29/2015  . Placenta previa 11/27/2015  . Immunity status testing 11/10/2015  . Routine general medical examination at a health care facility 03/13/2015  . Encounter for routine gynecological examination 03/13/2015  . Family history of colon cancer 03/13/2015  . FIBROCYSTIC BREAST DISEASE 02/05/2007    Past Surgical History:  Procedure Laterality Date  . CESAREAN SECTION N/A 01/23/2016   Procedure: CESAREAN SECTION;  Surgeon: Richarda Overlie, MD;  Location: [facility];  Service: Obstetrics;  Laterality: N/A;  primary edc 03/07/16   . CRYOTHERAPY  2009  . DILATION AND EVACUATION N/A 10/12/2014   Procedure: DILATATION AND EVACUATION;  Surgeon: Meriel Pica, MD;  Location: [facility];  Service: Gynecology;  Laterality: N/A;    OB History    Gravida  5   Para  3   Term  1   Preterm  2   AB  2   Living  2     SAB  1   IAB      Ectopic  1   Multiple  0   Live Births  2            Home Medications    Prior to Admission medications   Medication Sig Start Date End Date Taking? Authorizing Provider   nitrofurantoin, macrocrystal-monohydrate, (MACROBID) 100 MG capsule Take 1 capsule (100 mg total) by mouth 2 (two) times daily. 02/23/21  Yes Mickie Bail, NP    Family History Family History  Problem Relation Age of Onset  . Colon cancer Father   . Esophageal varices Father   . Hypertension Father   . Stroke Father   . Atrial fibrillation Mother   . Autism Son     Social History Social History   Tobacco Use  . Smoking status: Former Smoker    Packs/day: 0.50    Years: 3.00    Pack years: 1.50    Types: Cigarettes  . Smokeless tobacco: Never Used  Substance Use Topics  . Alcohol use: No    Alcohol/week: 0.0 standard drinks  . Drug use: No     Allergies   Amoxapine and related, Aspirin, Penicillins, Sulfonamide derivatives, and Terbutaline   Review of Systems Review of Systems  Constitutional: Negative for chills and fever.  Respiratory: Negative for cough and shortness of breath.   Cardiovascular: Negative for chest pain and palpitations.  Gastrointestinal: Negative for abdominal pain, diarrhea, nausea and vomiting.  Genitourinary: Positive for dysuria, frequency and urgency. Negative for hematuria, pelvic pain and vaginal discharge.  Skin: Negative  for color change and rash.  All other systems reviewed and are negative.    Physical Exam Triage Vital Signs ED Triage Vitals  Enc Vitals Group     BP      Pulse      Resp      Temp      Temp src      SpO2      Weight      Height      Head Circumference      Peak Flow      Pain Score      Pain Loc      Pain Edu?      Excl. in GC?    No data found.  Updated Vital Signs BP 121/77   Pulse 89   Temp 98.1 F (36.7 C) (Oral)   Resp 16   Ht 4\' 11"  (1.499 m)   Wt 126 lb (57.2 kg)   SpO2 96%   BMI 25.45 kg/m   Visual Acuity Right Eye Distance:   Left Eye Distance:   Bilateral Distance:    Right Eye Near:   Left Eye Near:    Bilateral Near:     Physical Exam Vitals and nursing note reviewed.   Constitutional:      General: She is not in acute distress.    Appearance: She is well-developed. She is not ill-appearing.  HENT:     Head: Normocephalic and atraumatic.     Mouth/Throat:     Mouth: Mucous membranes are moist.  Eyes:     Conjunctiva/sclera: Conjunctivae normal.  Cardiovascular:     Rate and Rhythm: Normal rate and regular rhythm.     Heart sounds: Normal heart sounds.  Pulmonary:     Effort: Pulmonary effort is normal. No respiratory distress.     Breath sounds: Normal breath sounds.  Abdominal:     General: Bowel sounds are normal.     Palpations: Abdomen is soft.     Tenderness: There is no abdominal tenderness. There is no right CVA tenderness, left CVA tenderness, guarding or rebound.  Musculoskeletal:     Cervical back: Neck supple.  Skin:    General: Skin is warm and dry.  Neurological:     General: No focal deficit present.     Mental Status: She is alert and oriented to person, place, and time.     Gait: Gait normal.  Psychiatric:        Mood and Affect: Mood normal.        Behavior: Behavior normal.      UC Treatments / Results  Labs (all labs ordered are listed, but only abnormal results are displayed) Labs Reviewed  POCT URINALYSIS DIP (MANUAL ENTRY) - Abnormal; Notable for the following components:      Result Value   Color, UA orange (*)    Glucose, UA =100 (*)    Ketones, POC UA trace (5) (*)    Blood, UA moderate (*)    Protein Ur, POC =100 (*)    Nitrite, UA Positive (*)    Leukocytes, UA Large (3+) (*)    All other components within normal limits  URINE CULTURE    EKG   Radiology No results found.  Procedures Procedures (including critical care time)  Medications Ordered in UC Medications - No data to display  Initial Impression / Assessment and Plan / UC Course  I have reviewed the triage vital signs and the nursing notes.  Pertinent labs &  imaging results that were available during my care of the patient were  reviewed by me and considered in my medical decision making (see chart for details).   UTI.  Urine culture pending.  Treating with Macrobid (patient allergic to PCN so not using cephalexin).  Discussed with patient that we will call her if the culture shows the need to change or discontinue the antibiotic.  Instructed her to follow-up with her PCP if her symptoms are not improving.  She agrees to plan of care.   Final Clinical Impressions(s) / UC Diagnoses   Final diagnoses:  Urinary tract infection with hematuria, site unspecified     Discharge Instructions     Take the antibiotic as directed.  The urine culture is pending.  We will call you if it shows the need to change or discontinue your antibiotic.    Follow up with your primary care provider if your symptoms are not improving.        ED Prescriptions    Medication Sig Dispense Auth. Provider   nitrofurantoin, macrocrystal-monohydrate, (MACROBID) 100 MG capsule Take 1 capsule (100 mg total) by mouth 2 (two) times daily. 10 capsule Mickie Bail, NP     PDMP not reviewed this encounter.   Mickie Bail, NP 02/23/21 1400

## 2021-02-23 NOTE — Discharge Instructions (Addendum)
Take the antibiotic as directed.  The urine culture is pending.  We will call you if it shows the need to change or discontinue your antibiotic.    Follow up with your primary care provider if your symptoms are not improving.    

## 2021-02-25 LAB — URINE CULTURE: Culture: >=100000 — AB

## 2021-05-11 IMAGING — CR DG CHEST 2V
1 series · 2 of 2 positions shown · non-contrast
Comparison: 06/14/2010

CLINICAL DATA: Shortness of breath history of COVID

EXAM:
CHEST - 2 VIEW

[Series 1: dg chest 2 view · 0.14mm/px · 2 of 2 slices shown]
[im 1/2]
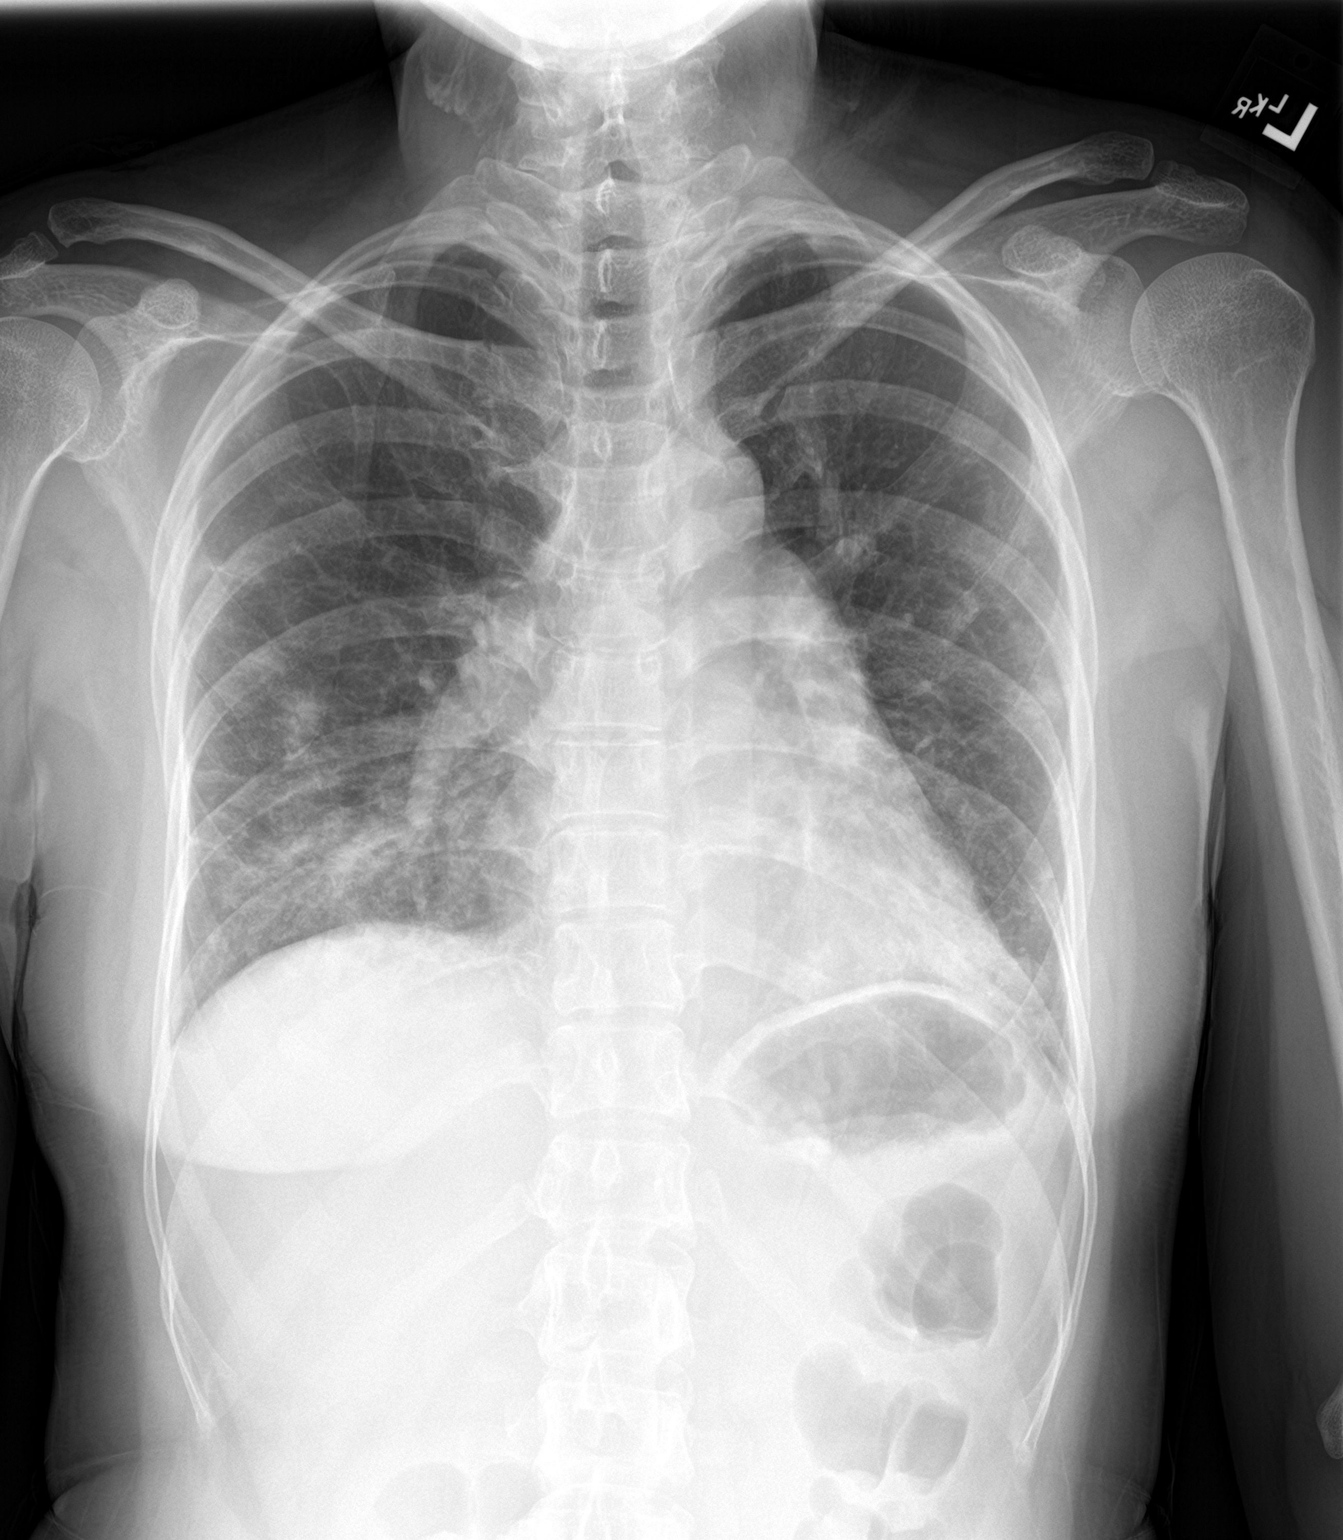
[im 2/2]
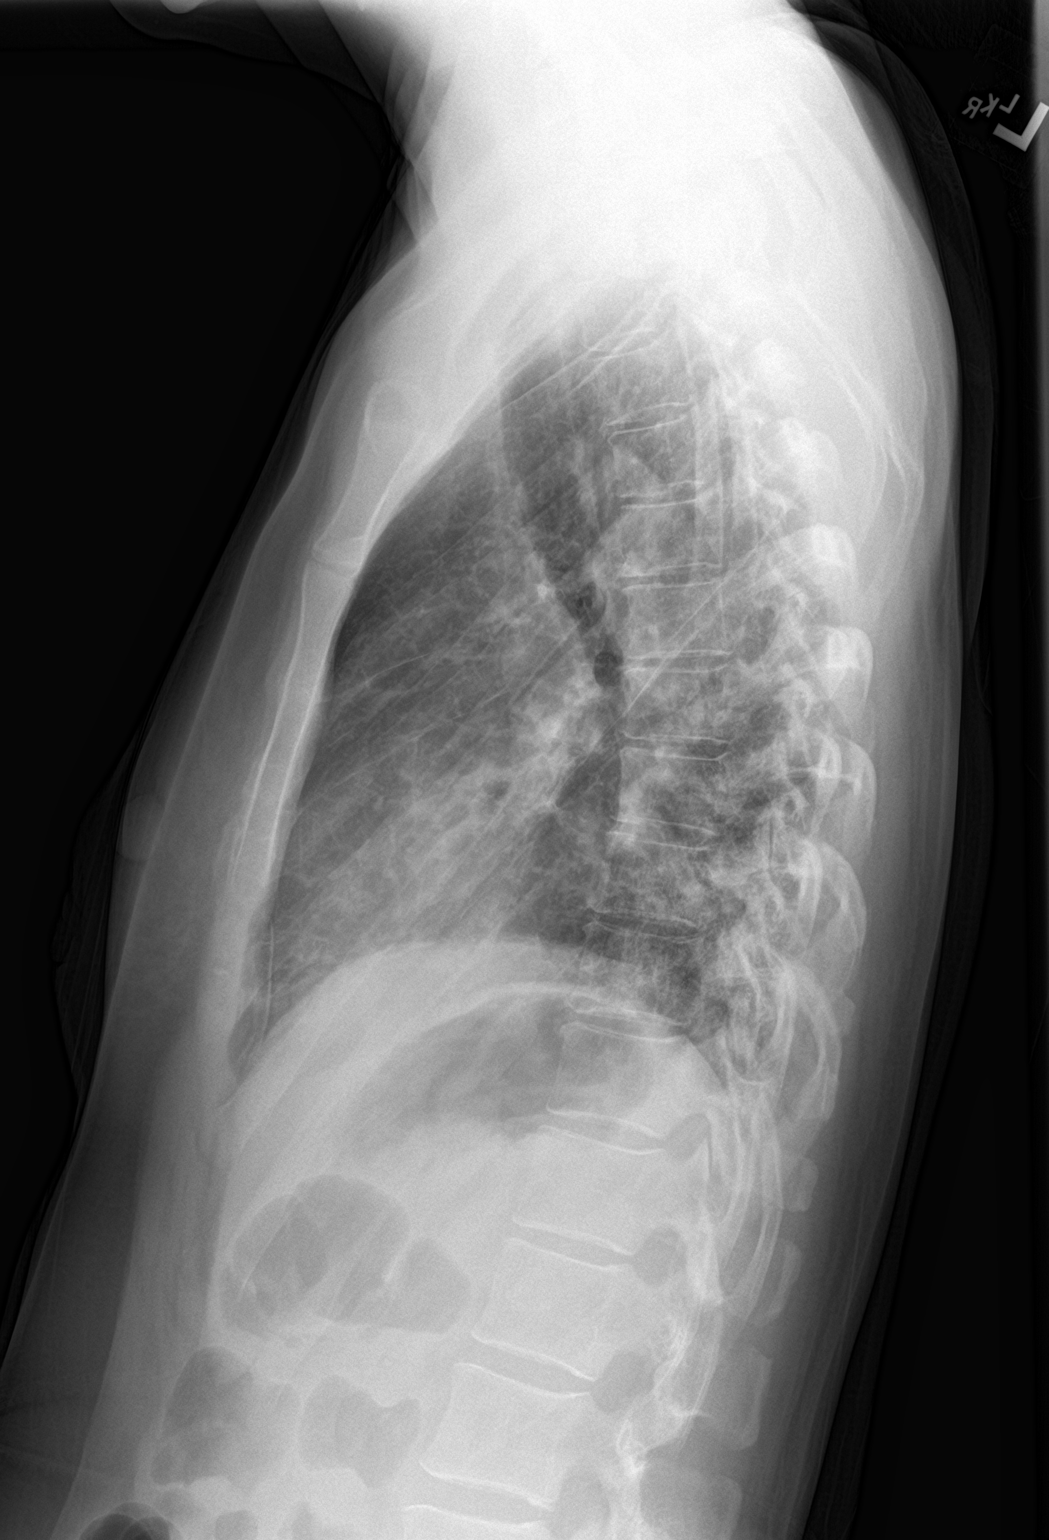

[2 of 2 positions shown; findings below may reference images not displayed]

FINDINGS: Ill-defined bilateral airspace opacities and consolidations. No
pleural effusion. Heart size within normal limits. Slightly enlarged
appearing central pulmonary vessels. No pneumothorax.
IMPRESSION: Ill-defined bilateral airspace opacities and consolidations,
consistent with multifocal pneumonia and presumably due to history
of COVID.

## 2021-06-06 ENCOUNTER — Other Ambulatory Visit: Payer: Self-pay

## 2021-06-06 ENCOUNTER — Encounter: Payer: Self-pay | Admitting: Family Medicine

## 2021-06-06 ENCOUNTER — Ambulatory Visit (INDEPENDENT_AMBULATORY_CARE_PROVIDER_SITE_OTHER): Payer: No Typology Code available for payment source | Admitting: Family Medicine

## 2021-06-06 DIAGNOSIS — R1013 Epigastric pain: Secondary | ICD-10-CM | POA: Insufficient documentation

## 2021-06-06 LAB — CBC WITH DIFFERENTIAL/PLATELET
Basophils Absolute: 0 10*3/uL (ref 0.0–0.1)
Basophils Relative: 0.2 % (ref 0.0–3.0)
Eosinophils Absolute: 0.1 10*3/uL (ref 0.0–0.7)
Eosinophils Relative: 1.6 % (ref 0.0–5.0)
HCT: 42 % (ref 36.0–46.0)
Hemoglobin: 13.6 g/dL (ref 12.0–15.0)
Lymphocytes Relative: 23.2 % (ref 12.0–46.0)
Lymphs Abs: 1.8 10*3/uL (ref 0.7–4.0)
MCHC: 32.4 g/dL (ref 30.0–36.0)
MCV: 75.1 fl — ABNORMAL LOW (ref 78.0–100.0)
Monocytes Absolute: 0.5 10*3/uL (ref 0.1–1.0)
Monocytes Relative: 5.9 % (ref 3.0–12.0)
Neutro Abs: 5.3 10*3/uL (ref 1.4–7.7)
Neutrophils Relative %: 69.1 % (ref 43.0–77.0)
Platelets: 360 10*3/uL (ref 150.0–400.0)
RBC: 5.6 Mil/uL — ABNORMAL HIGH (ref 3.87–5.11)
RDW: 13.6 % (ref 11.5–15.5)
WBC: 7.6 10*3/uL (ref 4.0–10.5)

## 2021-06-06 LAB — COMPREHENSIVE METABOLIC PANEL
ALT: 23 U/L (ref 0–35)
AST: 18 U/L (ref 0–37)
Albumin: 3.9 g/dL (ref 3.5–5.2)
Alkaline Phosphatase: 52 U/L (ref 39–117)
BUN: 12 mg/dL (ref 6–23)
CO2: 29 mEq/L (ref 19–32)
Calcium: 9.3 mg/dL (ref 8.4–10.5)
Chloride: 101 mEq/L (ref 96–112)
Creatinine, Ser: 0.87 mg/dL (ref 0.40–1.20)
GFR: 84.13 mL/min (ref >60.00)
Glucose, Bld: 98 mg/dL (ref 70–99)
Potassium: 3.9 mEq/L (ref 3.5–5.1)
Sodium: 138 mEq/L (ref 135–145)
Total Bilirubin: 0.7 mg/dL (ref 0.2–1.2)
Total Protein: 7.5 g/dL (ref 6.0–8.3)

## 2021-06-06 LAB — LIPASE: Lipase: 11 U/L (ref 11.0–59.0)

## 2021-06-06 MED ORDER — ONDANSETRON HCL 4 MG PO TABS: 4.0000 mg | ORAL_TABLET | Freq: Three times a day (TID) | ORAL | 0 refills | Status: DC | PRN

## 2021-06-06 MED ORDER — OMEPRAZOLE 20 MG PO CPDR: 20.0000 mg | DELAYED_RELEASE_CAPSULE | Freq: Two times a day (BID) | ORAL | 1 refills | Status: DC

## 2021-06-06 NOTE — Patient Instructions (Addendum)
Keep your diet bland  Sip fluids -don't get dehydrated  Avoid acids and carbonation and caffeine  Avoid nsaids   If diarrhea worsens or any new symptoms alert [place]   Labs today  Start omeprazole twice daily   Try zofran for nausea

## 2021-06-06 NOTE — Assessment & Plan Note (Addendum)
Burning in nature with some nausea and loose stool for 5 days  Suspect gastritis based on hx and exam  No dark stool and no R sided pain Lab today incl cmet and cbc and lipase  Pt developed hives after taking pepcid so will avoid the H2 blockers Px omeprazole 20 mg to take bid  zofran 4 mg for nausea (she has taken before) Pending lab results will check in and make longer plan  inst to call if symptoms suddenly worsen or change

## 2021-06-06 NOTE — Progress Notes (Signed)
Subjective:    Patient ID: Natalie Kelley, female    DOB: 29-May-1981, 40 y.o.   MRN: [id number]  This visit occurred during the SARS-CoV-2 public health emergency.  Safety protocols were in place, including screening questions prior to the visit, additional usage of staff PPE, and extensive cleaning of exam room while observing appropriate contact time as indicated for disinfecting solutions.   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  Very high stress environment  Hours are better- gets time off when she needs it   No dark stools   Period is less regular  Last one was 7/23   Since then  Loose stools (not immediate)  Nausea (once per night)  Epigastric burning - still there, is not as bad as it was  Localized /not in the chest  No right sided pain (still has a gallbladder)   Not pregnant-partner with vasectomy    Took a pepcid on Sunday nt- got hives all over  Feels some irritation in throat   Tums helped a little short term  Did not try a ppi   Family hx of PUD in mother  No personal h/o this or H pylori  Patient Active Problem List   Diagnosis Date Noted   Epigastric pain 06/06/2021   Screening-pulmonary TB 07/04/2020   Vasa previa in labor and delivery, antepartum 01/02/2016   Placental abruption in second trimester 11/29/2015   Placenta previa 11/27/2015   Immunity status testing 11/10/2015   Routine general medical examination at a health care facility 03/13/2015   Encounter for routine gynecological examination 03/13/2015   Family  history of colon cancer 03/13/2015   FIBROCYSTIC BREAST DISEASE 02/05/2007   Past Medical History:  Diagnosis Date   Abnormal pap 2009   Ectopic pregnancy    Miscarriage within last 12 months    No pertinent past medical history    Past Surgical History:  Procedure Laterality Date   CESAREAN SECTION N/A 01/23/2016   Procedure: CESAREAN SECTION;  Surgeon: Richarda Overlie, MD;  Location: [facility];  Service: Obstetrics;  Laterality: N/A;  primary edc 03/07/16    CRYOTHERAPY  2009   DILATION AND EVACUATION N/A 10/12/2014   Procedure: DILATATION AND EVACUATION;  Surgeon: Meriel Pica, MD;  Location: [facility];  Service: Gynecology;  Laterality: N/A;   Social History   Tobacco Use   Smoking status: Former    Packs/day: 0.50    Years: 3.00    Pack years: 1.50    Types: Cigarettes   Smokeless tobacco: Never  Substance Use Topics   Alcohol use: No    Alcohol/week: 0.0 standard drinks   Drug use: No   Family History  Problem Relation Age of Onset   Colon cancer Father    Esophageal varices Father    Hypertension Father    Stroke Father    Atrial fibrillation Mother    Autism Son    Allergies  Allergen Reactions  Amoxapine And Related Hives   Aspirin Hives    Pt is able to take ibuprofen without problems.   Penicillins Hives    Has patient had a PCN reaction causing immediate rash, facial/tongue/throat swelling, SOB or lightheadedness with hypotension: No Has patient had a PCN reaction causing severe rash involving mucus membranes or skin necrosis: No Has patient had a PCN reaction that required hospitalization No Has patient had a PCN reaction occurring within the last 10 years: Yes If all of the above answers are "NO", then may proceed with Cephalosporin use.    Pepcid [Famotidine] Itching   Sulfonamide Derivatives Hives and Other (See Comments)    fever   Terbutaline Hives    Skin discoloration and skin peeling   No current outpatient medications on file prior to  visit.   No current facility-administered medications on file prior to visit.     Review of Systems  Constitutional:  Negative for activity change, appetite change, fatigue, fever and unexpected weight change.  HENT:  Negative for congestion, ear pain, rhinorrhea, sinus pressure and sore throat.   Eyes:  Negative for pain, redness and visual disturbance.  Respiratory:  Negative for cough, shortness of breath and wheezing.   Cardiovascular:  Negative for chest pain and palpitations.  Gastrointestinal:  Positive for abdominal pain, nausea and vomiting. Negative for abdominal distention, anal bleeding, blood in stool, constipation, diarrhea and rectal pain.  Endocrine: Negative for polydipsia and polyuria.  Genitourinary:  Negative for dysuria, frequency, pelvic pain and urgency.  Musculoskeletal:  Negative for arthralgias, back pain and myalgias.  Skin:  Negative for pallor and rash.  Allergic/Immunologic: Negative for environmental allergies.  Neurological:  Negative for dizziness, syncope and headaches.  Hematological:  Negative for adenopathy. Does not bruise/bleed easily.  Psychiatric/Behavioral:  Negative for decreased concentration and dysphoric mood. The patient is not nervous/anxious.       Objective:   Physical Exam Constitutional:      General: She is not in acute distress.    Appearance: Normal appearance. She is well-developed and normal weight. She is not ill-appearing or diaphoretic.  HENT:     Head: Normocephalic and atraumatic.     Mouth/Throat:     Mouth: Mucous membranes are moist.  Eyes:     General: No scleral icterus.    Conjunctiva/sclera: Conjunctivae normal.     Pupils: Pupils are equal, round, and reactive to light.  Cardiovascular:     Rate and Rhythm: Normal rate and regular rhythm.     Heart sounds: Normal heart sounds.  Pulmonary:     Effort: Pulmonary effort is normal. No respiratory distress.     Breath sounds: Normal breath sounds. No wheezing or  rales.  Abdominal:     General: Abdomen is flat. Bowel sounds are normal. There is no distension or abdominal bruit.     Palpations: Abdomen is soft. There is no hepatomegaly, splenomegaly, mass or pulsatile mass.     Tenderness: There is abdominal tenderness in the epigastric area and left upper quadrant. There is no right CVA tenderness, left CVA tenderness, guarding or rebound. Negative signs include Murphy's sign, McBurney's sign and psoas sign.     Comments: Tender over epigastrium  No pain with movement or percussion    Musculoskeletal:     Cervical back: Normal range of motion and neck supple.  Lymphadenopathy:     Cervical: No cervical adenopathy.  Skin:    General: Skin is warm and dry.     Coloration: Skin  is not jaundiced or pale.     Findings: No erythema or rash.  Neurological:     Mental Status: She is alert.  Psychiatric:        Mood and Affect: Mood normal.          Assessment & Plan:   Problem List Items Addressed This Visit       Other   Epigastric pain    Burning in nature with some nausea and loose stool for 5 days  Suspect gastritis based on hx and exam  No dark stool and no R sided pain Lab today incl cmet and cbc and lipase  Pt developed hives after taking pepcid so will avoid the H2 blockers Px omeprazole 20 mg to take bid  zofran 4 mg for nausea (she has taken before) Pending lab results will check in and make longer plan  inst to call if symptoms suddenly worsen or change      Relevant Orders   Comprehensive metabolic panel   CBC with Differential/Platelet   Lipase

## 2021-06-08 ENCOUNTER — Ambulatory Visit (INDEPENDENT_AMBULATORY_CARE_PROVIDER_SITE_OTHER): Payer: No Typology Code available for payment source | Admitting: Family Medicine

## 2021-06-08 ENCOUNTER — Encounter: Payer: Self-pay | Admitting: Family Medicine

## 2021-06-08 ENCOUNTER — Other Ambulatory Visit: Payer: Self-pay

## 2021-06-08 VITALS — BP 120/84 | HR 90 | Temp 97.3°F | Ht 59.25 in | Wt 128.6 lb

## 2021-06-08 DIAGNOSIS — R300 Dysuria: Secondary | ICD-10-CM

## 2021-06-08 DIAGNOSIS — R1013 Epigastric pain: Secondary | ICD-10-CM | POA: Diagnosis not present

## 2021-06-08 DIAGNOSIS — N3000 Acute cystitis without hematuria: Secondary | ICD-10-CM | POA: Diagnosis not present

## 2021-06-08 DIAGNOSIS — R21 Rash and other nonspecific skin eruption: Secondary | ICD-10-CM | POA: Diagnosis not present

## 2021-06-08 LAB — POC URINALSYSI DIPSTICK (AUTOMATED)
Blood, UA: NEGATIVE
Glucose, UA: NEGATIVE
Nitrite, UA: POSITIVE
Protein, UA: POSITIVE — AB
Spec Grav, UA: 1.025 (ref 1.010–1.025)
Urobilinogen, UA: 4 E.U./dL — AB
pH, UA: 5.5 (ref 5.0–8.0)

## 2021-06-08 MED ORDER — NITROFURANTOIN MONOHYD MACRO 100 MG PO CAPS: 100.0000 mg | ORAL_CAPSULE | Freq: Two times a day (BID) | ORAL | 0 refills | Status: DC

## 2021-06-08 NOTE — Assessment & Plan Note (Signed)
Today's rash is more consistent with papular pruritic rash however she describes urticarial rash earlier in the week. ?cipro reaction.

## 2021-06-08 NOTE — Assessment & Plan Note (Signed)
Ongoing, just started prilosec. Will give this more time.  ?cipro related symptoms - given temporal association to starting cipro 500mg  1 wk course for UTI treated at [facility].  Will hold further cipro at this time.

## 2021-06-08 NOTE — Assessment & Plan Note (Signed)
Anticipate return of UTI, recently treated with cipro 500mg  bid 7d course. No culture results available from [facility]. Azo complicates UA interpretation. UCx sent.  Will start macrobid BID 1 wk course.  Avoid cipro as per below.

## 2021-06-08 NOTE — Progress Notes (Signed)
Patient ID: Natalie Kelley, female    DOB: Apr 30, 1981, 40 y.o.   MRN: [id number]  This visit was conducted in person.  BP 120/84   Pulse 90   Temp (!) 97.3 F (36.3 C) (Temporal)   Ht 4' 11.25" (1.505 m)   Wt 128 lb 9 oz (58.3 kg)   LMP 05/12/2021   SpO2 97%   BMI 25.75 kg/m    CC: lower abd pain, urinary symptoms Subjective:   HPI: Natalie Kelley is a 40 y.o. female presenting on 06/08/2021 for Abdominal Pain (C/o low abd pain, urinary urgency and pain when urinating.  Sxs started yesterday.  Seen recently for similar sxs. )   1d h/o urgency, frequency, dysuria, staying nauseated (see prior OV). Some suprapubic discomfort along with ongoing epigastric discomfort.  Denies fevers/chills, flank pain, back pain.  LMP - 05/12/2021, just started again today a few days early.  Took pyridium this morning.   Just completed Cipro 500mg  bid 7 day course the first week of August for UTI treated by [facility] - symptoms included frequency, urgency, dysuria. No UCx sent.  Prior UTI 02/2021 (>100k pansensitive Ecoli) seen at local [facility]  Prior UTI to that was 5 yrs ago.  Just started travel nurse gig.   Ongoing epigastric pain for the past week despite prilosec and zofran. Some dehydration - which could have precipitated current UTI.  Pepcid may have caused hives. She continues breaking out in rash ?hives even off pepcid.   No flushing, syncope or near-syncope, hypotension, abdominal cramping. She has had some burning epigastric pain - see above. Some loose stools without diarrhea.   Recently tested negative for COVID.      Relevant past medical, surgical, family and social history reviewed and updated as indicated. Interim medical history since our last visit reviewed. Allergies and medications reviewed and updated. Outpatient Medications Prior to Visit  Medication Sig Dispense Refill   omeprazole (PRILOSEC) 20 MG capsule Take 1 capsule (20 mg total) by mouth 2 (two) times daily. 60  capsule 1   ondansetron (ZOFRAN) 4 MG tablet Take 1 tablet (4 mg total) by mouth every 8 (eight) hours as needed for nausea or vomiting. 20 tablet 0   No facility-administered medications prior to visit.     Per HPI unless specifically indicated in ROS section below Review of Systems  Objective:  BP 120/84   Pulse 90   Temp (!) 97.3 F (36.3 C) (Temporal)   Ht 4' 11.25" (1.505 m)   Wt 128 lb 9 oz (58.3 kg)   LMP 05/12/2021   SpO2 97%   BMI 25.75 kg/m   Wt Readings from Last 3 Encounters:  06/08/21 128 lb 9 oz (58.3 kg)  06/06/21 130 lb 1 oz (59 kg)  02/23/21 126 lb (57.2 kg)      Physical Exam Vitals and nursing note reviewed.  Constitutional:      Appearance: Normal appearance. She is not ill-appearing.  Abdominal:     General: Bowel sounds are normal. There is no distension.     Palpations: Abdomen is soft. There is no mass.     Tenderness: There is abdominal tenderness (mild-mod) in the epigastric area. There is no guarding or rebound. Negative signs include Murphy's sign.     Hernia: No hernia is present.  Musculoskeletal:     Right lower leg: No edema.     Left lower leg: No edema.  Skin:    General: Skin is warm and dry.  Findings: Rash present.     Comments: Papular rash to inner thighs, some spots on upper arms  Neurological:     Mental Status: She is alert.  Psychiatric:        Mood and Affect: Mood normal.        Behavior: Behavior normal.      Results for orders placed or performed in visit on 06/08/21  POCT Urinalysis Dipstick (Automated)  Result Value Ref Range   Color, UA Amber    Clarity, UA clear    Glucose, UA Negative Negative   Bilirubin, UA 3+    Ketones, UA +/-    Spec Grav, UA 1.025 1.010 - 1.025   Blood, UA negative    pH, UA 5.5 5.0 - 8.0   Protein, UA Positive (A) Negative   Urobilinogen, UA 4.0 (A) 0.2 or 1.0 E.U./dL   Nitrite, UA positive    Leukocytes, UA Small (1+) (A) Negative    Assessment & Plan:  This visit  occurred during the SARS-CoV-2 public health emergency.  Safety protocols were in place, including screening questions prior to the visit, additional usage of staff PPE, and extensive cleaning of exam room while observing appropriate contact time as indicated for disinfecting solutions.   Problem List Items Addressed This Visit     UTI (urinary tract infection)    Anticipate return of UTI, recently treated with cipro 500mg  bid 7d course. No culture results available from [facility]. Azo complicates UA interpretation. UCx sent.  Will start macrobid BID 1 wk course.  Avoid cipro as per below.       Relevant Medications   nitrofurantoin, macrocrystal-monohydrate, (MACROBID) 100 MG capsule   Epigastric pain    Ongoing, just started prilosec. Will give this more time.  ?cipro related symptoms - given temporal association to starting cipro 500mg  1 wk course for UTI treated at [facility].  Will hold further cipro at this time.       Skin rash    Today's rash is more consistent with papular pruritic rash however she describes urticarial rash earlier in the week. ?cipro reaction.       Other Visit Diagnoses     Dysuria    -  Primary   Relevant Orders   POCT Urinalysis Dipstick (Automated) (Completed)   Urine Culture        Meds ordered this encounter  Medications   nitrofurantoin, macrocrystal-monohydrate, (MACROBID) 100 MG capsule    Sig: Take 1 capsule (100 mg total) by mouth 2 (two) times daily.    Dispense:  14 capsule    Refill:  0   Orders Placed This Encounter  Procedures   Urine Culture   POCT Urinalysis Dipstick (Automated)    Patient Instructions  We will send off urine culture.  I'd like to place you on macrobid course for a week. Let know if ongoing symptoms despite treatment.  Continue omeprazole 20mg  daily.  Continue to limit spicy foods, dark sodas, caffeine, acidic foods which can all irritate bladder.   Follow up plan: Return if symptoms  worsen or fail to improve.  [facility], MD

## 2021-06-08 NOTE — Patient Instructions (Addendum)
We will send off urine culture.  I'd like to place you on macrobid course for a week. Let us know if ongoing symptoms despite treatment.  Continue omeprazole 20mg  daily.  Continue to limit spicy foods, dark sodas, caffeine, acidic foods which can all irritate bladder.

## 2021-06-09 LAB — URINE CULTURE
MICRO NUMBER:: 12266632
Result:: NO GROWTH
SPECIMEN QUALITY:: ADEQUATE

## 2021-06-13 ENCOUNTER — Telehealth: Payer: Self-pay | Admitting: *Deleted

## 2021-06-13 DIAGNOSIS — R1013 Epigastric pain: Secondary | ICD-10-CM

## 2021-06-13 MED ORDER — SUCRALFATE 1 G PO TABS
1.0000 g | ORAL_TABLET | Freq: Three times a day (TID) | ORAL | 1 refills | Status: DC
Start: 2021-06-13 — End: 2022-01-22

## 2021-06-13 NOTE — Telephone Encounter (Signed)
I put in the urgent referral  Is symptomatic  Let her know that there has been a back up with GI and will do our best   Update me next week re: if carafate helps  Thanks

## 2021-06-13 NOTE — Telephone Encounter (Signed)
Pt notified of Dr. Royden Purl comments and verbalized understanding. Pt will start the carafate an she does want to proceed with urgent GI appt. She can go to 1st available either city if fine

## 2021-06-13 NOTE — Telephone Encounter (Signed)
Pt called to update Dr. Milinda Antis. She said that she is still having the same abd/ GI issues that she saw Dr. Milinda Antis for. Pt said the omeprazole BID didn't help at all and she is still having to take tums and it got so bad yesterday that she took some pepto bismol also. Pt said today her stool is dark black and looks like coffee grounds. Pt not sure if it's old blood or side eff of pepto bismol but she wanted to make sure PCP was aware and also asked what is the next step

## 2021-06-13 NOTE — Telephone Encounter (Signed)
Most likely the dark stool is from pepto If it does not go away after stopping pepto let me know   If dizzy or s/s of dehydration of severe pain alert [place] and go to ER  I sent a px for carafate to try to her pharmacy This coats the stomach Take it with a glass of water as directed  If this helps , I will send more   I would like to get an urgent GI consult as well  We will have to take first available most likely but if she has a pref for a practice or area please let me know  I expect they may want to get an endoscopy    Let me know if any diarrhea or fever or vomiting  Also if any R sided pain

## 2021-06-13 NOTE — Telephone Encounter (Signed)
Pt aware.

## 2021-06-28 ENCOUNTER — Other Ambulatory Visit: Payer: Self-pay | Admitting: Family Medicine

## 2021-07-03 NOTE — Telephone Encounter (Signed)
Called patient to discuss refill request for Omeprazole. Patient did not request it, as per phone note on 06/13/21 patient is not taking this any longer due to no relief with this. Patient asked to have this removed from her medication list. Done.

## 2021-07-06 ENCOUNTER — Ambulatory Visit (INDEPENDENT_AMBULATORY_CARE_PROVIDER_SITE_OTHER): Payer: No Typology Code available for payment source

## 2021-07-06 ENCOUNTER — Other Ambulatory Visit: Payer: Self-pay

## 2021-07-06 DIAGNOSIS — Z23 Encounter for immunization: Secondary | ICD-10-CM

## 2021-08-21 ENCOUNTER — Ambulatory Visit: Payer: No Typology Code available for payment source | Admitting: Gastroenterology

## 2022-01-22 ENCOUNTER — Ambulatory Visit
Admission: EM | Admit: 2022-01-22 | Discharge: 2022-01-22 | Disposition: A | Discharge status: Home / Self Care | Payer: No Typology Code available for payment source | Attending: Emergency Medicine | Admitting: Emergency Medicine

## 2022-01-22 ENCOUNTER — Encounter: Payer: Self-pay | Admitting: Emergency Medicine

## 2022-01-22 DIAGNOSIS — R319 Hematuria, unspecified: Secondary | ICD-10-CM | POA: Insufficient documentation

## 2022-01-22 DIAGNOSIS — N390 Urinary tract infection, site not specified: Secondary | ICD-10-CM | POA: Insufficient documentation

## 2022-01-22 LAB — POCT URINALYSIS DIP (MANUAL ENTRY)
Bilirubin, UA: NEGATIVE
Glucose, UA: NEGATIVE mg/dL
Ketones, POC UA: NEGATIVE mg/dL
Nitrite, UA: POSITIVE — AB
Protein Ur, POC: NEGATIVE mg/dL
Spec Grav, UA: 1.015 (ref 1.010–1.025)
Urobilinogen, UA: 0.2 E.U./dL
pH, UA: 6 (ref 5.0–8.0)

## 2022-01-22 LAB — POCT URINE PREGNANCY: Preg Test, Ur: NEGATIVE

## 2022-01-22 MED ORDER — NITROFURANTOIN MONOHYD MACRO 100 MG PO CAPS: 100.0000 mg | ORAL_CAPSULE | Freq: Two times a day (BID) | ORAL | 0 refills | Status: DC

## 2022-01-22 NOTE — ED Triage Notes (Signed)
Pt here with increased urinary frequency and urgency with some urinary discomfort since yesterday.  ?

## 2022-01-22 NOTE — ED Provider Notes (Signed)
?[facility] ? ? ? ?CSN: [id number] ?Arrival date & time: 01/22/22  1121 ? ? ?  ? ?History   ?Chief Complaint ?Chief Complaint  ?Patient presents with  ? Dysuria  ? ? ?HPI ?Natalie Kelley is a 41 y.o. female.  Patient presents with dysuria, urinary frequency, urinary urgency x1 day.  She states this is similar to previous episodes of UTI.  She denies fever, chills, abdominal pain, vomiting, diarrhea, flank pain, vaginal discharge, pelvic pain, or other symptoms.  Treatment at home with Azo.  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 ? ?The history is provided by the patient and medical records.  ? ?Past Medical History:  ?Diagnosis Date  ? Abnormal pap 2009  ? Ectopic pregnancy   ? Miscarriage within last 12 months   ? No pertinent past medical history   ? ? ?Patient Active Problem List  ? Diagnosis Date Noted  ? Skin rash 06/08/2021  ? Epigastric pain 06/06/2021  ? Screening-pulmonary TB 07/04/2020  ? Vasa previa in labor and delivery, antepartum 01/02/2016  ? Placental abruption in second trimester 11/29/2015  ? Placenta previa 11/27/2015  ? Immunity status testing 11/10/2015  ? Routine general medical examination at a health care facility 03/13/2015  ? Encounter for routine gynecological examination 03/13/2015  ? Family history of colon cancer 03/13/2015  ? UTI (urinary tract infection) 04/01/2008  ? FIBROCYSTIC BREAST DISEASE 02/05/2007  ? ? ?Past Surgical History:  ?Procedure Laterality Date  ? CESAREAN SECTION N/A 01/23/2016  ? Procedure: CESAREAN SECTION;  Surgeon: Molli Posey, MD;  Location: [facility];  Service: Obstetrics;  Laterality: N/A;  primary ?edc 03/07/16 ?  ? CRYOTHERAPY  2009  ? DILATION AND EVACUATION N/A 10/12/2014  ? Procedure: DILATATION AND EVACUATION;  Surgeon: Margarette Asal, MD;  Location: [facility];  Service: Gynecology;  Laterality: N/A;  ? ? ?OB History   ? ? Gravida  ?5  ? Para  ?3  ? Term  ?1  ? Preterm  ?2  ? AB  ?2  ? Living  ?2  ?  ? ? SAB  ?1  ?  IAB  ?   ? Ectopic  ?1  ? Multiple  ?0  ? Live Births  ?2  ?   ?  ?  ? ? ? ?Home Medications   ? ?Prior to Admission medications   ?Medication Sig Start Date End Date Taking? Authorizing Provider  ?nitrofurantoin, macrocrystal-monohydrate, (MACROBID) 100 MG capsule Take 1 capsule (100 mg total) by mouth 2 (two) times daily. 01/22/22  Yes Sharion Balloon, NP  ? ? ?Family History ?Family History  ?Problem Relation Age of Onset  ? Colon cancer Father   ? Esophageal varices Father   ? Hypertension Father   ? Stroke Father   ? Atrial fibrillation Mother   ? Autism Son   ? ? ?Social History ?Social History  ? ?Tobacco Use  ? Smoking status: Former  ?  Packs/day: 0.50  ?  Years: 3.00  ?  Pack years: 1.50  ?  Types: Cigarettes  ? Smokeless tobacco: Never  ?Substance Use Topics  ? Alcohol use: No  ?  Alcohol/week: 0.0 standard drinks  ? Drug use: No  ? ? ? ?Allergies   ?Amoxapine and related, Aspirin, Penicillins, Pepcid [famotidine], Sulfonamide derivatives, and Terbutaline ? ? ?Review of Systems ?Review of Systems  ?Constitutional:  Negative for chills and fever.  ?Gastrointestinal:  Negative for abdominal pain, diarrhea and vomiting.  ?  Genitourinary:  Positive for dysuria, frequency and urgency. Negative for flank pain, hematuria, pelvic pain and vaginal discharge.  ?Skin:  Negative for color change and rash.  ?All other systems reviewed and are negative. ? ? ?Physical Exam ?Triage Vital Signs ?ED Triage Vitals  ?Enc Vitals Group  ?   BP   ?   Pulse   ?   Resp   ?   Temp   ?   Temp src   ?   SpO2   ?   Weight   ?   Height   ?   Head Circumference   ?   Peak Flow   ?   Pain Score   ?   Pain Loc   ?   Pain Edu?   ?   Excl. in [place]?   ? ?No data found. ? ?Updated Vital Signs ?BP 106/72   Pulse 76   Temp 97.9 ?F (36.6 ?C)   Resp 18   SpO2 98%  ? ?Visual Acuity ?Right Eye Distance:   ?Left Eye Distance:   ?Bilateral Distance:   ? ?Right Eye Near:   ?Left Eye Near:    ?Bilateral Near:    ? ?Physical Exam ?Vitals and nursing note  reviewed.  ?Constitutional:   ?   General: She is not in acute distress. ?   Appearance: She is well-developed. She is not ill-appearing.  ?HENT:  ?   Mouth/Throat:  ?   Mouth: Mucous membranes are moist.  ?Cardiovascular:  ?   Rate and Rhythm: Normal rate and regular rhythm.  ?   Heart sounds: Normal heart sounds.  ?Pulmonary:  ?   Effort: Pulmonary effort is normal. No respiratory distress.  ?   Breath sounds: Normal breath sounds.  ?Abdominal:  ?   General: Bowel sounds are normal.  ?   Palpations: Abdomen is soft.  ?   Tenderness: There is no abdominal tenderness. There is no right CVA tenderness, left CVA tenderness, guarding or rebound.  ?Musculoskeletal:  ?   Cervical back: Neck supple.  ?Skin: ?   General: Skin is warm and dry.  ?Neurological:  ?   Mental Status: She is alert.  ?Psychiatric:     ?   Mood and Affect: Mood normal.     ?   Behavior: Behavior normal.  ? ? ? ?UC Treatments / Results  ?Labs ?(all labs ordered are listed, but only abnormal results are displayed) ?Labs Reviewed  ?POCT URINALYSIS DIP (MANUAL ENTRY) - Abnormal; Notable for the following components:  ?    Result Value  ? Blood, UA small (*)   ? Nitrite, UA Positive (*)   ? Leukocytes, UA Small (1+) (*)   ? All other components within normal limits  ?POCT URINE PREGNANCY - Normal  ?URINE CULTURE  ? ? ?EKG ? ? ?Radiology ?No results found. ? ?Procedures ?Procedures (including critical care time) ? ?Medications Ordered in UC ?Medications - No data to display ? ?Initial Impression / Assessment and Plan / UC Course  ?I have reviewed the triage vital signs and the nursing notes. ? ?Pertinent labs & imaging results that were available during my care of the patient were reviewed by me and considered in my medical decision making (see chart for details). ? ?  ?UTI.  Treating with Macrobid. Urine culture pending. Discussed with patient that we will call her if the urine culture shows the need to change or discontinue the antibiotic. Instructed  her to follow-up with her  PCP if her symptoms are not improving. Patient agrees to plan of care.    ? ?Final Clinical Impressions(s) / UC Diagnoses  ? ?Final diagnoses:  ?Urinary tract infection with hematuria, site unspecified  ? ? ? ?Discharge Instructions   ? ?  ?Take the antibiotic as directed.  The urine culture is pending.  We will call you if it shows the need to change or discontinue your antibiotic.   ? ?Follow up with your primary care provider if your symptoms are not improving.   ? ? ? ? ? ?ED Prescriptions   ? ? Medication Sig Dispense Auth. Provider  ? nitrofurantoin, macrocrystal-monohydrate, (MACROBID) 100 MG capsule Take 1 capsule (100 mg total) by mouth 2 (two) times daily. 10 capsule Sharion Balloon, NP  ? ?  ? ?PDMP not reviewed this encounter. ?  ?Sharion Balloon, NP ?01/22/22 1204 ? ?

## 2022-01-22 NOTE — Discharge Instructions (Addendum)
Take the antibiotic as directed.  The urine culture is pending.  We will call you if it shows the need to change or discontinue your antibiotic.    Follow up with your primary care provider if your symptoms are not improving.    

## 2022-01-23 LAB — URINE CULTURE: Culture: <10000 — AB

## 2022-04-08 ENCOUNTER — Ambulatory Visit
Admission: EM | Admit: 2022-04-08 | Discharge: 2022-04-08 | Disposition: A | Discharge status: Home / Self Care | Payer: Self-pay | Attending: Family Medicine | Admitting: Family Medicine

## 2022-04-08 DIAGNOSIS — N390 Urinary tract infection, site not specified: Secondary | ICD-10-CM | POA: Insufficient documentation

## 2022-04-08 DIAGNOSIS — R300 Dysuria: Secondary | ICD-10-CM | POA: Insufficient documentation

## 2022-04-08 LAB — POCT URINALYSIS DIP (MANUAL ENTRY)
Bilirubin, UA: NEGATIVE
Glucose, UA: NEGATIVE mg/dL
Ketones, POC UA: NEGATIVE mg/dL
Nitrite, UA: POSITIVE — AB
Protein Ur, POC: 30 mg/dL — AB
Spec Grav, UA: 1.02 (ref 1.010–1.025)
Urobilinogen, UA: 0.2 E.U./dL
pH, UA: 6 (ref 5.0–8.0)

## 2022-04-08 MED ORDER — NITROFURANTOIN MONOHYD MACRO 100 MG PO CAPS: 100.0000 mg | ORAL_CAPSULE | Freq: Two times a day (BID) | ORAL | 0 refills | Status: AC

## 2022-04-08 NOTE — ED Provider Notes (Signed)
Natalie Kelley    CSN: [id number] Arrival date & time: 04/08/22  1247      History   Chief Complaint Chief Complaint  Patient presents with   Dysuria    HPI Natalie Kelley is a 41 y.o. female.   HPI Natalie Kelley is a 41 y.o. female presents for evaluation of urinary frequency, urgency and dysuria for approximately 1 week. Denies  fever, chills, abdominal pain or abnormal vaginal discharge or bleeding. Patient's last menstrual period was 03/21/2022 (exact date).   Past Medical History:  Diagnosis Date   Abnormal pap 2009   Ectopic pregnancy    Miscarriage within last 12 months    No pertinent past medical history     Patient Active Problem List   Diagnosis Date Noted   Skin rash 06/08/2021   Epigastric pain 06/06/2021   Screening-pulmonary TB 07/04/2020   Vasa previa in labor and delivery, antepartum 01/02/2016   Placental abruption in second trimester 11/29/2015   Placenta previa 11/27/2015   Immunity status testing 11/10/2015   Routine general medical examination at a health care facility 03/13/2015   Encounter for routine gynecological examination 03/13/2015   Family history of colon cancer 03/13/2015   UTI (urinary tract infection) 04/01/2008   FIBROCYSTIC BREAST DISEASE 02/05/2007    Past Surgical History:  Procedure Laterality Date   CESAREAN SECTION N/A 01/23/2016   Procedure: CESAREAN SECTION;  Surgeon: Richarda Overlie, MD;  Location: [facility];  Service: Obstetrics;  Laterality: N/A;  primary edc 03/07/16    CRYOTHERAPY  2009   DILATION AND EVACUATION N/A 10/12/2014   Procedure: DILATATION AND EVACUATION;  Surgeon: Meriel Pica, MD;  Location: [facility];  Service: Gynecology;  Laterality: N/A;    OB History     Gravida  5   Para  3   Term  1   Preterm  2   AB  2   Living  2      SAB  1   IAB      Ectopic  1   Multiple  0   Live Births  2            Home Medications    Prior to Admission medications   Medication Sig Start  Date End Date Taking? Authorizing Provider  nitrofurantoin, macrocrystal-monohydrate, (MACROBID) 100 MG capsule Take 1 capsule (100 mg total) by mouth 2 (two) times daily for 3 days. 04/08/22 04/11/22  Bing Neighbors, FNP    Family History Family History  Problem Relation Age of Onset   Colon cancer Father    Esophageal varices Father    Hypertension Father    Stroke Father    Atrial fibrillation Mother    Autism Son     Social History Social History   Tobacco Use   Smoking status: Former    Packs/day: 0.50    Years: 3.00    Total pack years: 1.50    Types: Cigarettes   Smokeless tobacco: Never  Vaping Use   Vaping Use: Never used  Substance Use Topics   Alcohol use: No    Alcohol/week: 0.0 standard drinks of alcohol   Drug use: No     Allergies   Amoxapine and related, Aspirin, Penicillins, Pepcid [famotidine], Sulfonamide derivatives, and Terbutaline   Review of Systems Review of Systems Pertinent negatives listed in HPI   Physical Exam Triage Vital Signs ED Triage Vitals  Enc Vitals Group     BP 04/08/22 1326 109/75  Pulse Rate 04/08/22 1326 96     Resp 04/08/22 1326 18     Temp 04/08/22 1326 98.1 F (36.7 C)     Temp Source 04/08/22 1326 Oral     SpO2 04/08/22 1326 98 %     Weight --      Height --      Head Circumference --      Peak Flow --      Pain Score 04/08/22 1322 0     Pain Loc --      Pain Edu? --      Excl. in [place]? --    No data found.  Updated Vital Signs BP 109/75 (BP Location: Left Arm)   Pulse 96   Temp 98.1 F (36.7 C) (Oral)   Resp 18   LMP 03/21/2022 (Exact Date)   SpO2 98%   Breastfeeding No   Visual Acuity Right Eye Distance:   Left Eye Distance:   Bilateral Distance:    Right Eye Near:   Left Eye Near:    Bilateral Near:     Physical Exam General appearance: alert, well developed, well nourished, cooperative and in no distress Head: Normocephalic, without obvious abnormality, atraumatic Respiratory:  Respirations even and unlabored, normal respiratory rate Heart: rate and rhythm normal.   CVA:  no flank pain Extremities: No gross deformities Skin: Skin color, texture, turgor normal. No rashes seen  Psych: Appropriate mood and affect.  UC Treatments / Results  Labs (all labs ordered are listed, but only abnormal results are displayed) Labs Reviewed  POCT URINALYSIS DIP (MANUAL ENTRY) - Abnormal; Notable for the following components:      Result Value   Color, UA straw (*)    Clarity, UA cloudy (*)    Blood, UA large (*)    Protein Ur, POC =30 (*)    Nitrite, UA Positive (*)    Leukocytes, UA Large (3+) (*)    All other components within normal limits  URINE CULTURE    EKG   Radiology No results found.  Procedures Procedures (including critical care time)  Medications Ordered in UC Medications - No data to display  Initial Impression / Assessment and Plan / UC Course  I have reviewed the triage vital signs and the nursing notes.  Pertinent labs & imaging results that were available during my care of the patient were reviewed by me and considered in my medical decision making (see chart for details).      UA abnormal and findings consistent with UTI.  Empiric antibiotic treatment initiated.  Encouraged increase intake of water. Urine culture pending.  ER if symptoms become severe.  Follow-up with PCP if symptoms do not completely resolve.  Final Clinical Impressions(s) / UC Diagnoses   Final diagnoses:  Acute urinary tract infection     Discharge Instructions      Urine culture pending. Our office will contact you if your urine culture requires a change in treatment.     ED Prescriptions     Medication Sig Dispense Auth. Provider   nitrofurantoin, macrocrystal-monohydrate, (MACROBID) 100 MG capsule Take 1 capsule (100 mg total) by mouth 2 (two) times daily for 3 days. 6 capsule Bing Neighbors, FNP      PDMP not reviewed this encounter.    Bing Neighbors, [place] 04/09/22 [phone]

## 2022-04-08 NOTE — Discharge Instructions (Signed)
Urine culture pending. Our office will contact you if your urine culture requires a change in treatment.

## 2022-04-08 NOTE — ED Triage Notes (Signed)
Pt presents with urinary frequency, dysuria.  Initially 10 days ago then s/s improved with azo and restarted today.

## 2022-04-09 LAB — URINE CULTURE
Culture: NO GROWTH
Special Requests: NORMAL

## 2022-07-17 ENCOUNTER — Ambulatory Visit
Admission: EM | Admit: 2022-07-17 | Discharge: 2022-07-17 | Disposition: A | Discharge status: Home / Self Care | Payer: Self-pay | Attending: Emergency Medicine | Admitting: Emergency Medicine

## 2022-07-17 ENCOUNTER — Encounter: Payer: Self-pay | Admitting: Emergency Medicine

## 2022-07-17 DIAGNOSIS — N3001 Acute cystitis with hematuria: Secondary | ICD-10-CM | POA: Insufficient documentation

## 2022-07-17 LAB — POCT URINALYSIS DIP (MANUAL ENTRY)
Bilirubin, UA: NEGATIVE
Glucose, UA: NEGATIVE mg/dL
Ketones, POC UA: NEGATIVE mg/dL
Nitrite, UA: NEGATIVE
Protein Ur, POC: NEGATIVE mg/dL
Spec Grav, UA: <=1.005 — AB (ref 1.010–1.025)
Urobilinogen, UA: 0.2 E.U./dL
pH, UA: 6.5 (ref 5.0–8.0)

## 2022-07-17 LAB — POCT URINE PREGNANCY: Preg Test, Ur: NEGATIVE

## 2022-07-17 MED ORDER — NITROFURANTOIN MONOHYD MACRO 100 MG PO CAPS
100.0000 mg | ORAL_CAPSULE | Freq: Two times a day (BID) | ORAL | 0 refills | Status: AC
Start: 2022-07-17 — End: 2022-07-22

## 2022-07-17 MED ORDER — PHENAZOPYRIDINE HCL 200 MG PO TABS: 200.0000 mg | ORAL_TABLET | Freq: Three times a day (TID) | ORAL | 0 refills | Status: DC | PRN

## 2022-07-17 NOTE — ED Triage Notes (Signed)
Patient to Urgent Care with complaints of urinary frequency, urgency, and bladder pain. Reports symptoms started on Monday. Denies any known fevers. Denies any vaginal discharge.

## 2022-07-17 NOTE — Discharge Instructions (Addendum)
I have sent your urine off for culture to make sure that we have you on the correct antibiotic.  Continue hydrating.  Go to the ER for any signs of pyelonephritis

## 2022-07-17 NOTE — ED Provider Notes (Signed)
HPI  SUBJECTIVE:  Natalie Kelley is a 41 y.o. female who presents with 2 days of dysuria, urgency, frequency, cloudy and odorous urine.  No hematuria.  She reports bladder pain/pressure after urinating.  No abdominal, back, pelvic pain, nausea, vomiting, fevers, vaginal complaints.  She is in a long-term monogamous relationship with a female who is asymptomatic.  STDs are not a concern today.  No antibiotics in the past month. No antipyretic in the past 6 hours.  She tried hydrating with temporary improvement in her symptoms.  Symptoms worse with urination.  She has a past medical history of UTI, pyelonephritis, BV.  No history of nephrolithiasis, yeast vaginitis.  LMP: August 2019.  Denies the possibility being pregnant.  PCP: [facility].  Last urine culture that came back positive for UTI was in May 22 which was a E. coli UTI that was pansensitive.  Past Medical History:  Diagnosis Date   Abnormal pap 2009   Ectopic pregnancy    Miscarriage within last 12 months    No pertinent past medical history     Past Surgical History:  Procedure Laterality Date   CESAREAN SECTION N/A 01/23/2016   Procedure: CESAREAN SECTION;  Surgeon: Molli Posey, MD;  Location: [facility];  Service: Obstetrics;  Laterality: N/A;  primary edc 03/07/16    CRYOTHERAPY  2009   DILATION AND EVACUATION N/A 10/12/2014   Procedure: DILATATION AND EVACUATION;  Surgeon: Margarette Asal, MD;  Location: [facility];  Service: Gynecology;  Laterality: N/A;    Family History  Problem Relation Age of Onset   Colon cancer Father    Esophageal varices Father    Hypertension Father    Stroke Father    Atrial fibrillation Mother    Autism Son     Social History   Tobacco Use   Smoking status: Former    Packs/day: 0.50    Years: 3.00    Total pack years: 1.50    Types: Cigarettes   Smokeless tobacco: Never  Vaping Use   Vaping Use: Never used  Substance Use Topics   Alcohol use: No    Alcohol/week: 0.0 standard  drinks of alcohol   Drug use: No    No current facility-administered medications for this encounter.  Current Outpatient Medications:    nitrofurantoin, macrocrystal-monohydrate, (MACROBID) 100 MG capsule, Take 1 capsule (100 mg total) by mouth 2 (two) times daily for 5 days., Disp: 10 capsule, Rfl: 0   phenazopyridine (PYRIDIUM) 200 MG tablet, Take 1 tablet (200 mg total) by mouth 3 (three) times daily as needed for pain., Disp: 6 tablet, Rfl: 0  Allergies  Allergen Reactions   Amoxapine And Related Hives   Aspirin Hives    Pt is able to take ibuprofen without problems.   Penicillins Hives    Has patient had a PCN reaction causing immediate rash, facial/tongue/throat swelling, SOB or lightheadedness with hypotension: No Has patient had a PCN reaction causing severe rash involving mucus membranes or skin necrosis: No Has patient had a PCN reaction that required hospitalization No Has patient had a PCN reaction occurring within the last 10 years: Yes If all of the above answers are "NO", then may proceed with Cephalosporin use.    Pepcid [Famotidine] Itching   Sulfonamide Derivatives Hives and Other (See Comments)    fever   Terbutaline Hives    Skin discoloration and skin peeling     ROS  As noted in HPI.   Physical Exam  BP 120/74  Pulse 72   Temp 98.9 F (37.2 C)   Resp 18   Ht 4\' 11"  (1.499 m)   Wt 63.5 kg   LMP 06/18/2022   SpO2 96%   BMI 28.28 kg/m   Constitutional: Well developed, well nourished, no acute distress Eyes:  EOMI, conjunctiva normal bilaterally HENT: Normocephalic, atraumatic,mucus membranes moist Respiratory: Normal inspiratory effort Cardiovascular: Normal rate GI: nondistended.  Soft.  Positive suprapubic tenderness.  No flank tenderness Back: No CVAT skin: No rash, skin intact Musculoskeletal: no deformities Neurologic: Alert & oriented x 3, no focal neuro deficits Psychiatric: Speech and behavior appropriate   ED  Course   Medications - No data to display  Orders Placed This Encounter  Procedures   Urine Culture    Standing Status:   Standing    Number of Occurrences:   1    Order Specific Question:   Indication    Answer:   Dysuria   POCT urinalysis dipstick    Standing Status:   Standing    Number of Occurrences:   1   POCT urine pregnancy    Standing Status:   Standing    Number of Occurrences:   1    Results for orders placed or performed during the hospital encounter of 07/17/22 (from the past 24 hour(s))  POCT urinalysis dipstick     Status: Abnormal   Collection Time: 07/17/22  3:21 PM  Result Value Ref Range   Color, UA yellow yellow   Clarity, UA cloudy (A) clear   Glucose, UA negative negative mg/dL   Bilirubin, UA negative negative   Ketones, POC UA negative negative mg/dL   Spec Grav, UA 07/19/22 (A) 1.010 - 1.025   Blood, UA trace-intact (A) negative   pH, UA 6.5 5.0 - 8.0   Protein Ur, POC negative negative mg/dL   Urobilinogen, UA 0.2 0.2 or 1.0 E.U./dL   Nitrite, UA Negative Negative   Leukocytes, UA Moderate (2+) (A) Negative  POCT urine pregnancy     Status: None   Collection Time: 07/17/22  3:21 PM  Result Value Ref Range   Preg Test, Ur Negative Negative   No results found.  ED Clinical Impression  1. Acute cystitis with hematuria      ED Assessment/Plan     Previous labs reviewed.  As noted in HPI.  Presentation consistent with a urinary tract infection.  She is not pregnant.  We will send off for culture to confirm antibiotic choice and diagnosis.  In the meantime, Pyridium and Macrobid.  Continue pushing fluids.  Follow-up with PCP as needed.  To the ER if she gets worse or for any signs of pyelonephritis.  Discussed labs,  MDM, treatment plan, and plan for follow-up with patient. Discussed sn/sx that should prompt return to the ED. patient agrees with plan.   Meds ordered this encounter  Medications   nitrofurantoin, macrocrystal-monohydrate,  (MACROBID) 100 MG capsule    Sig: Take 1 capsule (100 mg total) by mouth 2 (two) times daily for 5 days.    Dispense:  10 capsule    Refill:  0   phenazopyridine (PYRIDIUM) 200 MG tablet    Sig: Take 1 tablet (200 mg total) by mouth 3 (three) times daily as needed for pain.    Dispense:  6 tablet    Refill:  0      *This clinic note was created using 07/19/22. Therefore, there may be occasional mistakes despite careful proofreading.  ?  Domenick Gong, MD 07/17/22 1723

## 2022-07-19 LAB — URINE CULTURE: Culture: >=100000 — AB

## 2022-11-23 ENCOUNTER — Ambulatory Visit
Admission: EM | Admit: 2022-11-23 | Discharge: 2022-11-23 | Disposition: A | Discharge status: Home / Self Care | Payer: Self-pay | Attending: Emergency Medicine | Admitting: Emergency Medicine

## 2022-11-23 ENCOUNTER — Encounter: Payer: Self-pay | Admitting: Emergency Medicine

## 2022-11-23 DIAGNOSIS — R300 Dysuria: Secondary | ICD-10-CM | POA: Insufficient documentation

## 2022-11-23 DIAGNOSIS — R350 Frequency of micturition: Secondary | ICD-10-CM | POA: Insufficient documentation

## 2022-11-23 LAB — POCT URINALYSIS DIP (MANUAL ENTRY)
Bilirubin, UA: NEGATIVE
Blood, UA: NEGATIVE
Glucose, UA: NEGATIVE mg/dL
Ketones, POC UA: NEGATIVE mg/dL
Nitrite, UA: NEGATIVE
Protein Ur, POC: NEGATIVE mg/dL
Spec Grav, UA: <=1.005 — AB (ref 1.010–1.025)
Urobilinogen, UA: 0.2 E.U./dL
pH, UA: 6 (ref 5.0–8.0)

## 2022-11-23 MED ORDER — CEPHALEXIN 500 MG PO CAPS: 500.0000 mg | ORAL_CAPSULE | Freq: Two times a day (BID) | ORAL | 0 refills | Status: AC

## 2022-11-23 NOTE — Discharge Instructions (Addendum)
Your urinalysis shows a small amount of Natalie Kelley blood cells which fight infection however does not show nitrates which is the enzyme released from that bacteria, inconclusive for urinary infection, urine has been sent to the lab to determine if bacteria was growing  Based on your history of reoccurring urinary infection she will be started on antibiotic today, take Keflex every morning and every evening for 5 days  You may use over-the-counter Azo to help minimize your symptoms until antibiotic removes bacteria, this medication will turn your urine orange  Increase your fluid intake through use of water  As always practice good hygiene, wiping front to back and avoidance of scented vaginal products to prevent further irritation  If symptoms continue to persist after use of medication or recur please follow-up with urgent care or your primary doctor as needed

## 2022-11-23 NOTE — ED Triage Notes (Signed)
Pt presents with dysuria and urinary frequency x 2 days  

## 2022-11-23 NOTE — ED Provider Notes (Signed)
Roderic Palau    CSN: [id number] Arrival date & time: 11/23/22  1226      History   Chief Complaint Chief Complaint  Patient presents with   Urinary Frequency    Lower abdominal pain, pain when voiding, dysuria - Entered by patient   Dysuria    HPI Natalie Kelley is a 42 y.o. female.   Patient presents for evaluation of urinary frequency, urgency, dysuria and lower abdominal pressure beginning 2 days ago.  Has attempted use of Azo which has been helpful.  History of recurrent urinary infections.  Denies hematuria, flank pain, fevers, vaginal symptoms.  Past Medical History:  Diagnosis Date   Abnormal pap 2009   Ectopic pregnancy    Miscarriage within last 12 months    No pertinent past medical history     Patient Active Problem List   Diagnosis Date Noted   Skin rash 06/08/2021   Epigastric pain 06/06/2021   Screening-pulmonary TB 07/04/2020   Vasa previa in labor and delivery, antepartum 01/02/2016   Placental abruption in second trimester 11/29/2015   Placenta previa 11/27/2015   Immunity status testing 11/10/2015   Routine general medical examination at a health care facility 03/13/2015   Encounter for routine gynecological examination 03/13/2015   Family history of colon cancer 03/13/2015   UTI (urinary tract infection) 04/01/2008   FIBROCYSTIC BREAST DISEASE 02/05/2007    Past Surgical History:  Procedure Laterality Date   CESAREAN SECTION N/A 01/23/2016   Procedure: CESAREAN SECTION;  Surgeon: Molli Posey, MD;  Location: [facility];  Service: Obstetrics;  Laterality: N/A;  primary edc 03/07/16    CRYOTHERAPY  2009   DILATION AND EVACUATION N/A 10/12/2014   Procedure: DILATATION AND EVACUATION;  Surgeon: Margarette Asal, MD;  Location: [facility];  Service: Gynecology;  Laterality: N/A;    OB History     Gravida  5   Para  3   Term  1   Preterm  2   AB  2   Living  2      SAB  1   IAB      Ectopic  1   Multiple  0   Live Births  2             Home Medications    Prior to Admission medications   Medication Sig Start Date End Date Taking? Authorizing Provider  cephALEXin (KEFLEX) 500 MG capsule Take 1 capsule (500 mg total) by mouth 2 (two) times daily for 5 days. 11/23/22 11/28/22 Yes Taliesin Hartlage, Leitha Schuller, NP  phenazopyridine (PYRIDIUM) 200 MG tablet Take 1 tablet (200 mg total) by mouth 3 (three) times daily as needed for pain. 07/17/22   Melynda Ripple, MD    Family History Family History  Problem Relation Age of Onset   Colon cancer Father    Esophageal varices Father    Hypertension Father    Stroke Father    Atrial fibrillation Mother    Autism Son     Social History Social History   Tobacco Use   Smoking status: Former    Packs/day: 0.50    Years: 3.00    Total pack years: 1.50    Types: Cigarettes   Smokeless tobacco: Never  Vaping Use   Vaping Use: Never used  Substance Use Topics   Alcohol use: No    Alcohol/week: 0.0 standard drinks of alcohol   Drug use: No     Allergies   Amoxapine and related, Amoxicillin-pot clavulanate, Aspirin,  Penicillins, Pepcid [famotidine], Sulfonamide derivatives, and Terbutaline   Review of Systems Review of Systems  Constitutional: Negative.   HENT: Negative.    Respiratory: Negative.    Cardiovascular: Negative.   Genitourinary:  Positive for dysuria, frequency and pelvic pain. Negative for decreased urine volume, difficulty urinating, dyspareunia, enuresis, flank pain, genital sores, hematuria, menstrual problem, urgency, vaginal bleeding, vaginal discharge and vaginal pain.     Physical Exam Triage Vital Signs ED Triage Vitals  Enc Vitals Group     BP 11/23/22 1317 120/79     Pulse Rate 11/23/22 1317 71     Resp 11/23/22 1317 16     Temp 11/23/22 1317 98.6 F (37 C)     Temp Source 11/23/22 1317 Oral     SpO2 11/23/22 1317 97 %     Weight --      Height --      Head Circumference --      Peak Flow --      Pain Score 11/23/22 1314 0      Pain Loc --      Pain Edu? --      Excl. in [place]? --    No data found.  Updated Vital Signs BP 120/79 (BP Location: Left Arm)   Pulse 71   Temp 98.6 F (37 C) (Oral)   Resp 16   LMP 11/09/2022   SpO2 97%   Visual Acuity Right Eye Distance:   Left Eye Distance:   Bilateral Distance:    Right Eye Near:   Left Eye Near:    Bilateral Near:     Physical Exam Constitutional:      Appearance: Normal appearance.  HENT:     Head: Normocephalic.  Eyes:     Extraocular Movements: Extraocular movements intact.  Pulmonary:     Effort: Pulmonary effort is normal.  Abdominal:     General: Abdomen is flat. Bowel sounds are normal.     Palpations: Abdomen is soft.     Tenderness: There is no abdominal tenderness. There is no right CVA tenderness or left CVA tenderness.  Genitourinary:    Comments: deferred Neurological:     Mental Status: She is alert and oriented to person, place, and time. Mental status is at baseline.      UC Treatments / Results  Labs (all labs ordered are listed, but only abnormal results are displayed) Labs Reviewed  POCT URINALYSIS DIP (MANUAL ENTRY) - Abnormal; Notable for the following components:      Result Value   Spec Grav, UA <=1.005 (*)    Leukocytes, UA Trace (*)    All other components within normal limits    EKG   Radiology No results found.  Procedures Procedures (including critical care time)  Medications Ordered in UC Medications - No data to display  Initial Impression / Assessment and Plan / UC Course  I have reviewed the triage vital signs and the nursing notes.  Pertinent labs & imaging results that were available during my care of the patient were reviewed by me and considered in my medical decision making (see chart for details).  Urinary frequency, dysuria  Urinalysis showing trace leukocytes, negative for nitrates, but to culture discussed with patient, negative for infection however in comparison to urinalysis on  07/17/2022 similar appearance at that time culture tested positive for Klebsiella therefore we will prophylactically treat today based on symptomology, Keflex prescribed, may continue use of Azo, recommended additional supportive measures, may follow-up with urgent care  if symptoms persist or worsen Final Clinical Impressions(s) / UC Diagnoses   Final diagnoses:  Urinary frequency  Dysuria     Discharge Instructions      Your urinalysis shows a small amount of Vanette Noguchi blood cells which fight infection however does not show nitrates which is the enzyme released from that bacteria, inconclusive for urinary infection, urine has been sent to the lab to determine if bacteria was growing  Based on your history of reoccurring urinary infection she will be started on antibiotic today, take Keflex every morning and every evening for 5 days  You may use over-the-counter Azo to help minimize your symptoms until antibiotic removes bacteria, this medication will turn your urine orange  Increase your fluid intake through use of water  As always practice good hygiene, wiping front to back and avoidance of scented vaginal products to prevent further irritation  If symptoms continue to persist after use of medication or recur please follow-up with urgent care or your primary doctor as needed    ED Prescriptions     Medication Sig Dispense Auth. Provider   cephALEXin (KEFLEX) 500 MG capsule Take 1 capsule (500 mg total) by mouth 2 (two) times daily for 5 days. 10 capsule Hans Eden, NP      PDMP not reviewed this encounter.   Hans Eden, NP 11/23/22 1329

## 2022-11-24 LAB — URINE CULTURE

## 2022-12-30 ENCOUNTER — Telehealth: Payer: Self-pay | Admitting: Family Medicine

## 2022-12-30 NOTE — Telephone Encounter (Signed)
Pt called asking for [facility] to submit orders for an upcoming elective surgery? Pt stated she needed the following labs done: CVC CMT PT INR  PTT  HIV Pt stated she also needed a EKG as well & a surgical clearance. Pt is aware she may need appt with [facility] for surgical clearance. Call back # [phone]

## 2022-12-30 NOTE — Telephone Encounter (Signed)
Spoke to pt, scheduled ov for 3/13

## 2022-12-30 NOTE — Telephone Encounter (Signed)
Please schedule surgical clearance appt, we can discuss and do labs at that appt

## 2023-01-01 ENCOUNTER — Ambulatory Visit: Payer: 59 | Admitting: Family Medicine

## 2023-01-01 ENCOUNTER — Encounter: Payer: Self-pay | Admitting: Family Medicine

## 2023-01-01 VITALS — BP 118/80 | HR 66 | Temp 97.8°F | Ht 59.0 in | Wt 136.0 lb

## 2023-01-01 DIAGNOSIS — Z01818 Encounter for other preprocedural examination: Secondary | ICD-10-CM | POA: Diagnosis not present

## 2023-01-01 DIAGNOSIS — Z01810 Encounter for preprocedural cardiovascular examination: Secondary | ICD-10-CM | POA: Diagnosis not present

## 2023-01-01 LAB — PROTIME-INR
INR: 0.9 ratio (ref 0.8–1.0)
Prothrombin Time: 10.2 s (ref 9.6–13.1)

## 2023-01-01 LAB — CBC WITH DIFFERENTIAL/PLATELET
Basophils Absolute: 0.1 10*3/uL (ref 0.0–0.1)
Basophils Relative: 0.6 % (ref 0.0–3.0)
Eosinophils Absolute: 0.5 10*3/uL (ref 0.0–0.7)
Eosinophils Relative: 5.6 % — ABNORMAL HIGH (ref 0.0–5.0)
HCT: 41.1 % (ref 36.0–46.0)
Hemoglobin: 13.3 g/dL (ref 12.0–15.0)
Lymphocytes Relative: 32.7 % (ref 12.0–46.0)
Lymphs Abs: 2.6 10*3/uL (ref 0.7–4.0)
MCHC: 32.3 g/dL (ref 30.0–36.0)
MCV: 76.3 fl — ABNORMAL LOW (ref 78.0–100.0)
Monocytes Absolute: 0.5 10*3/uL (ref 0.1–1.0)
Monocytes Relative: 6.3 % (ref 3.0–12.0)
Neutro Abs: 4.4 10*3/uL (ref 1.4–7.7)
Neutrophils Relative %: 54.8 % (ref 43.0–77.0)
Platelets: 330 10*3/uL (ref 150.0–400.0)
RBC: 5.39 Mil/uL — ABNORMAL HIGH (ref 3.87–5.11)
RDW: 14.4 % (ref 11.5–15.5)
WBC: 8.1 10*3/uL (ref 4.0–10.5)

## 2023-01-01 LAB — COMPREHENSIVE METABOLIC PANEL
ALT: 16 U/L (ref 0–35)
AST: 13 U/L (ref 0–37)
Albumin: 4.4 g/dL (ref 3.5–5.2)
Alkaline Phosphatase: 60 U/L (ref 39–117)
BUN: 9 mg/dL (ref 6–23)
CO2: 28 mEq/L (ref 19–32)
Calcium: 9.6 mg/dL (ref 8.4–10.5)
Chloride: 102 mEq/L (ref 96–112)
Creatinine, Ser: 0.82 mg/dL (ref 0.40–1.20)
GFR: 89.33 mL/min (ref >60.00)
Glucose, Bld: 101 mg/dL — ABNORMAL HIGH (ref 70–99)
Potassium: 4 mEq/L (ref 3.5–5.1)
Sodium: 139 mEq/L (ref 135–145)
Total Bilirubin: 0.6 mg/dL (ref 0.2–1.2)
Total Protein: 7.7 g/dL (ref 6.0–8.3)

## 2023-01-01 LAB — HCG, QUANTITATIVE, PREGNANCY: Quantitative HCG: <0.6 m[IU]/mL

## 2023-01-01 LAB — APTT: aPTT: 29.4 s (ref 25.4–36.8)

## 2023-01-01 NOTE — Progress Notes (Signed)
Subjective:    Patient ID: Natalie Kelley, female    DOB: Sep 29, 1981, 42 y.o.   MRN: [id number]  HPI Pt presents for visit for surgical clearance  Wt Readings from Last 3 Encounters:  01/01/23 136 lb (61.7 kg)  07/17/22 140 lb (63.5 kg)  06/08/21 128 lb 9 oz (58.3 kg)   27.47 kg/m  Vitals:   01/01/23 0929  BP: 118/80  Pulse: 66  Temp: 97.8 F (36.6 C)  SpO2: 99%    She is planning liposuction/ fat transfer  With [facility] in [place] to do in lower abdomen  Has worked hard with diet/exercise and cannot loose the last bit   Will be in [place] for a week  Then flying home Will be up and active the whole time      From phone note  Pt called asking for Nikisha Fleece to submit orders for an upcoming elective surgery? Pt stated she needed the following labs done: Cbc CMT PT INR  PTT  HIV Pt stated she also needed a EKG as well & a surgical clearance.     Paper notes need for hcg as well   Drug allergies reviewed   Past anesthesia  Had some over sedation from fentanyl, propofol , versed - had a hard time to wake up  BP Readings from Last 3 Encounters:  01/01/23 118/80  11/23/22 120/79  07/17/22 120/74   Pulse Readings from Last 3 Encounters:  01/01/23 66  11/23/22 71  07/17/22 72   EKG NSR rate of 64 Is physically fit    Recently tx with macrobid for uti  Review of Systems  Constitutional:  Negative for activity change, appetite change, fatigue, fever and unexpected weight change.  HENT:  Negative for congestion, ear pain, rhinorrhea, sinus pressure and sore throat.   Eyes:  Negative for pain, redness and visual disturbance.  Respiratory:  Negative for cough, shortness of breath and wheezing.   Cardiovascular:  Negative for chest pain and palpitations.  Gastrointestinal:  Negative for abdominal pain, blood in stool, constipation and diarrhea.  Endocrine: Negative for polydipsia and polyuria.  Genitourinary:  Negative for dysuria,  frequency and urgency.  Musculoskeletal:  Negative for arthralgias, back pain and myalgias.  Skin:  Negative for pallor and rash.  Allergic/Immunologic: Negative for environmental allergies.  Neurological:  Negative for dizziness, syncope and headaches.  Hematological:  Negative for adenopathy. Does not bruise/bleed easily.  Psychiatric/Behavioral:  Negative for decreased concentration and dysphoric mood. The patient is not nervous/anxious.        Objective:   Physical Exam Constitutional:      General: She is not in acute distress.    Appearance: Normal appearance. She is well-developed and normal weight. She is not ill-appearing or diaphoretic.  HENT:     Head: Normocephalic and atraumatic.     Mouth/Throat:     Mouth: Mucous membranes are moist.  Eyes:     General: No scleral icterus.       Right eye: No discharge.        Left eye: No discharge.     Conjunctiva/sclera: Conjunctivae normal.     Pupils: Pupils are equal, round, and reactive to light.  Neck:     Thyroid: No thyromegaly.     Vascular: No carotid bruit or JVD.  Cardiovascular:     Rate and Rhythm: Normal rate and regular rhythm.     Pulses: Normal pulses.     Heart sounds: Normal heart  sounds.     No gallop.  Pulmonary:     Effort: Pulmonary effort is normal. No respiratory distress.     Breath sounds: Normal breath sounds. No stridor. No wheezing, rhonchi or rales.  Chest:     Chest wall: No tenderness.  Abdominal:     General: There is no distension or abdominal bruit.     Palpations: Abdomen is soft. There is no mass.     Tenderness: There is no abdominal tenderness. There is no guarding or rebound.     Hernia: No hernia is present.  Musculoskeletal:     Cervical back: Normal range of motion and neck supple.     Right lower leg: No edema.     Left lower leg: No edema.  Lymphadenopathy:     Cervical: No cervical adenopathy.  Skin:    General: Skin is warm and dry.     Coloration: Skin is not pale.      Findings: No rash.  Neurological:     Mental Status: She is alert.     Cranial Nerves: No cranial nerve deficit.     Motor: No weakness.     Coordination: Coordination normal.     Deep Tendon Reflexes: Reflexes are normal and symmetric. Reflexes normal.  Psychiatric:        Mood and Affect: Mood normal.           Assessment & Plan:   Problem List Items Addressed This Visit       Other   Pre-operative clearance    Low risk  Planning liposuction in [place] No chronic med problems or blood thinners  Healthy  No chronic meds  Reviewed past drug reactions   EKG is reassuring  Lab today-pending will send paperwork       Relevant Orders   CBC with Differential/Platelet   Comprehensive metabolic panel   APTT   Protime-INR   HIV antibody (with reflex)   hCG, quantitative, pregnancy   Other Visit Diagnoses     Pre-operative cardiovascular examination    -  Primary   Relevant Orders   EKG 12-Lead (Completed)

## 2023-01-01 NOTE — Assessment & Plan Note (Addendum)
Low risk  Planning liposuction in [place] No chronic med problems or blood thinners  Healthy  No chronic meds  Reviewed past drug reactions   EKG is reassuring  Lab today-pending will send paperwork

## 2023-01-02 LAB — HIV ANTIBODY (ROUTINE TESTING W REFLEX): HIV 1&2 Ab, 4th Generation: NONREACTIVE

## 2023-02-07 ENCOUNTER — Telehealth: Payer: Self-pay | Admitting: Family Medicine

## 2023-02-07 NOTE — Telephone Encounter (Signed)
Called patient let know sending to address on file as requested.  No further action needed at this time.

## 2023-02-07 NOTE — Telephone Encounter (Signed)
Pt called asking for a copy of immunization records for her employment? Pt requested records to be mailed to the address on chart. Call back # [phone]

## 2023-09-12 NOTE — Addendum Note (Signed)
Encounter addended by: Deatra Canter, RN on: 09/12/2023 9:20 AM  Actions taken: Letter saved

## 2023-09-17 ENCOUNTER — Encounter (HOSPITAL_COMMUNITY): Payer: Self-pay

## 2023-09-17 ENCOUNTER — Ambulatory Visit (HOSPITAL_COMMUNITY)
Admission: EM | Admit: 2023-09-17 | Discharge: 2023-09-17 | Disposition: A | Discharge status: Home / Self Care | Payer: 59 | Attending: Nurse Practitioner | Admitting: Nurse Practitioner

## 2023-09-17 DIAGNOSIS — N3000 Acute cystitis without hematuria: Secondary | ICD-10-CM | POA: Insufficient documentation

## 2023-09-17 LAB — POCT URINALYSIS DIP (MANUAL ENTRY)
Bilirubin, UA: NEGATIVE
Glucose, UA: 100 mg/dL — AB
Ketones, POC UA: NEGATIVE mg/dL
Nitrite, UA: POSITIVE — AB
Protein Ur, POC: 30 mg/dL — AB
Spec Grav, UA: 1.01 (ref 1.010–1.025)
Urobilinogen, UA: 1 U/dL
pH, UA: 5 (ref 5.0–8.0)

## 2023-09-17 MED ORDER — NITROFURANTOIN MONOHYD MACRO 100 MG PO CAPS: 100.0000 mg | ORAL_CAPSULE | Freq: Two times a day (BID) | ORAL | 0 refills | Status: AC

## 2023-09-17 NOTE — ED Provider Notes (Signed)
[facility]    CSN: [id number] Arrival date & time: 09/17/23  1920      History   Chief Complaint Chief Complaint  Patient presents with   Dysuria    HPI Natalie Kelley is a 42 y.o. female.   Patient presents today with 1 day history of burning with urination, urinary frequency and urgency, suprapubic pain, chills, and nausea that improved with Zofran.  She also endorses decreased appetite today.  No new urinary incontinence, foul urinary odor, hematuria, abdominal pain, back pain, flank pain, vomiting, or diarrhea.  No vaginal discharge.  Reports she took Azo today around 3 PM which helped with her symptoms a little bit.  Has been trying to drink a lot of water today to flush out her bladder.  Reports history of UTI, last was approximately 6 months ago.      Past Medical History:  Diagnosis Date   Abnormal pap 2009   Ectopic pregnancy    Miscarriage within last 12 months    No pertinent past medical history     Patient Active Problem List   Diagnosis Date Noted   Pre-operative clearance 01/01/2023   Vasa previa in labor and delivery, antepartum 01/02/2016   Placental abruption in second trimester 11/29/2015   Placenta previa 11/27/2015   Routine general medical examination at a health care facility 03/13/2015   Encounter for routine gynecological examination 03/13/2015   Family history of colon cancer 03/13/2015   UTI (urinary tract infection) 04/01/2008   FIBROCYSTIC BREAST DISEASE 02/05/2007    Past Surgical History:  Procedure Laterality Date   CESAREAN SECTION N/A 01/23/2016   Procedure: CESAREAN SECTION;  Surgeon: Richarda Overlie, MD;  Location: [facility];  Service: Obstetrics;  Laterality: N/A;  primary edc 03/07/16    CRYOTHERAPY  2009   DILATION AND EVACUATION N/A 10/12/2014   Procedure: DILATATION AND EVACUATION;  Surgeon: Meriel Pica, MD;  Location: [facility];  Service: Gynecology;  Laterality: N/A;    OB History     Gravida  5   Para  3    Term  1   Preterm  2   AB  2   Living  2      SAB  1   IAB      Ectopic  1   Multiple  0   Live Births  2            Home Medications    Prior to Admission medications   Medication Sig Start Date End Date Taking? Authorizing Provider  nitrofurantoin, macrocrystal-monohydrate, (MACROBID) 100 MG capsule Take 1 capsule (100 mg total) by mouth 2 (two) times daily for 5 days. 09/17/23 09/22/23 Yes Valentino Nose, NP    Family History Family History  Problem Relation Age of Onset   Colon cancer Father    Esophageal varices Father    Hypertension Father    Stroke Father    Atrial fibrillation Mother    Autism Son     Social History Social History   Tobacco Use   Smoking status: Former    Current packs/day: 0.50    Average packs/day: 0.5 packs/day for 3.0 years (1.5 ttl pk-yrs)    Types: Cigarettes   Smokeless tobacco: Never  Vaping Use   Vaping status: Never Used  Substance Use Topics   Alcohol use: No    Alcohol/week: 0.0 standard drinks of alcohol   Drug use: No     Allergies   Amoxapine and related, Amoxicillin-pot clavulanate,  Aspirin, Penicillins, Pepcid [famotidine], Sulfonamide derivatives, and Terbutaline   Review of Systems Review of Systems Per HPI  Physical Exam Triage Vital Signs ED Triage Vitals [09/17/23 1935]  Encounter Vitals Group     BP 120/81     Systolic BP Percentile      Diastolic BP Percentile      Pulse Rate 89     Resp 16     Temp 98.6 F (37 C)     Temp Source Oral     SpO2 94 %     Weight 138 lb (62.6 kg)     Height 4\' 11"  (1.499 m)     Head Circumference      Peak Flow      Pain Score 4     Pain Loc      Pain Education      Exclude from Growth Chart    No data found.  Updated Vital Signs BP 120/81 (BP Location: Left Arm)   Pulse 89   Temp 98.6 F (37 C) (Oral)   Resp 16   Ht 4\' 11"  (1.499 m)   Wt 138 lb (62.6 kg)   LMP 08/22/2023 (Approximate)   SpO2 94%   BMI 27.87 kg/m   Visual  Acuity Right Eye Distance:   Left Eye Distance:   Bilateral Distance:    Right Eye Near:   Left Eye Near:    Bilateral Near:     Physical Exam Vitals and nursing note reviewed.  Constitutional:      General: She is not in acute distress.    Appearance: She is not toxic-appearing.  HENT:     Mouth/Throat:     Mouth: Mucous membranes are moist.     Pharynx: Oropharynx is clear.  Pulmonary:     Effort: Pulmonary effort is normal. No respiratory distress.     Breath sounds: Normal breath sounds. No wheezing, rhonchi or rales.  Abdominal:     General: Abdomen is flat. Bowel sounds are normal. There is no distension.     Palpations: Abdomen is soft. There is no mass.     Tenderness: There is no abdominal tenderness. There is no right CVA tenderness, left CVA tenderness or guarding.  Skin:    General: Skin is warm and dry.     Coloration: Skin is not jaundiced or pale.     Findings: No erythema.  Neurological:     Mental Status: She is alert and oriented to person, place, and time.     Motor: No weakness.     Gait: Gait normal.  Psychiatric:        Behavior: Behavior is cooperative.      UC Treatments / Results  Labs (all labs ordered are listed, but only abnormal results are displayed) Labs Reviewed  POCT URINALYSIS DIP (MANUAL ENTRY) - Abnormal; Notable for the following components:      Result Value   Color, UA red (*)    Clarity, UA cloudy (*)    Glucose, UA =100 (*)    Blood, UA moderate (*)    Protein Ur, POC =30 (*)    Nitrite, UA Positive (*)    Leukocytes, UA Large (3+) (*)    All other components within normal limits  URINE CULTURE    EKG   Radiology No results found.  Procedures Procedures (including critical care time)  Medications Ordered in UC Medications - No data to display  Initial Impression / Assessment and Plan / UC Course  I have reviewed the triage vital signs and the nursing notes.  Pertinent labs & imaging results that were  available during my care of the patient were reviewed by me and considered in my medical decision making (see chart for details).   Patient is well-appearing, normotensive, afebrile, not tachycardic, not tachypneic, oxygenating well on room air.    1. Acute cystitis without hematuria Urinalysis unreliable secondary to Azo use Urine culture pending The meantime, treat with Macrobid 100 mg twice daily for 5 days, continue Azo and plenty of hydration Strict ER precautions discussed  The patient was given the opportunity to ask questions.  All questions answered to their satisfaction.  The patient is in agreement to this plan.   Final Clinical Impressions(s) / UC Diagnoses   Final diagnoses:  Acute cystitis without hematuria     Discharge Instructions      Take the Macrobid to treat UTI.  Continue with hydration and azo as needed.     ED Prescriptions     Medication Sig Dispense Auth. Provider   nitrofurantoin, macrocrystal-monohydrate, (MACROBID) 100 MG capsule Take 1 capsule (100 mg total) by mouth 2 (two) times daily for 5 days. 10 capsule Valentino Nose, NP      PDMP not reviewed this encounter.   Valentino Nose, NP 09/17/23 1958

## 2023-09-17 NOTE — ED Triage Notes (Signed)
Patient here today with c/o pain in urination and vaginal discomfort X 1 day. Patient took an AZO with some relief.

## 2023-09-17 NOTE — Discharge Instructions (Signed)
Take the Macrobid to treat UTI.  Continue with hydration and azo as needed.

## 2023-09-19 LAB — URINE CULTURE: Culture: <10000 — AB

## 2023-09-21 ENCOUNTER — Telehealth: Payer: Self-pay

## 2023-11-17 ENCOUNTER — Telehealth: Payer: Self-pay | Admitting: Family Medicine

## 2023-11-17 NOTE — Telephone Encounter (Signed)
Patient requesting a copy of her immunization record. Thank you!  Copied from CRM [id number]. Topic: Medical Record Request - Other >> Nov 17, 2023  1:57 PM Gurney Maxin H wrote: Reason for CRM: Patient would like to know if she can have a copy of immunizations for her new job, please reach out to patient.  Sheray [phone] can leave a voicemail

## 2023-11-17 NOTE — Telephone Encounter (Signed)
Printed and placed up front with reception.  Patient notified.

## 2024-01-21 ENCOUNTER — Encounter: Payer: Self-pay | Admitting: Emergency Medicine

## 2024-01-21 ENCOUNTER — Ambulatory Visit
Admission: EM | Admit: 2024-01-21 | Discharge: 2024-01-21 | Disposition: A | Discharge status: Home / Self Care | Payer: Self-pay | Attending: Emergency Medicine | Admitting: Emergency Medicine

## 2024-01-21 ENCOUNTER — Other Ambulatory Visit: Payer: Self-pay

## 2024-01-21 DIAGNOSIS — N3000 Acute cystitis without hematuria: Secondary | ICD-10-CM | POA: Diagnosis present

## 2024-01-21 DIAGNOSIS — R350 Frequency of micturition: Secondary | ICD-10-CM | POA: Diagnosis present

## 2024-01-21 LAB — POCT URINALYSIS DIP (MANUAL ENTRY)
Bilirubin, UA: NEGATIVE
Glucose, UA: NEGATIVE mg/dL
Ketones, POC UA: NEGATIVE mg/dL
Nitrite, UA: POSITIVE — AB
Protein Ur, POC: NEGATIVE mg/dL
Spec Grav, UA: <=1.005 — AB
Urobilinogen, UA: 0.2 U/dL
pH, UA: 5.5

## 2024-01-21 MED ORDER — CIPROFLOXACIN HCL 500 MG PO TABS: 500.0000 mg | ORAL_TABLET | Freq: Two times a day (BID) | ORAL | 0 refills | Status: AC

## 2024-01-21 NOTE — ED Triage Notes (Signed)
 Patient c/o lower abdominal pain, burning with urination, urgency since yesterday

## 2024-01-21 NOTE — ED Provider Notes (Signed)
 Natalie Kelley    CSN: [id number] Arrival date & time: 01/21/24  1941      History   Chief Complaint Chief Complaint  Patient presents with   Dysuria         HPI Natalie Kelley is a 43 y.o. female.   Patient presents for evaluation of lower abdominal pain, urinary frequency, urgency and dysuria beginning 1 day ago.  Currently menstruating.  Denies hematuria, flank pain, fever or vaginal symptoms.  Has attempted use of over-the-counter medicine which was somewhat helpful.  History of reoccurring UTI  Past Medical History:  Diagnosis Date   Abnormal pap 2009   Ectopic pregnancy    Miscarriage within last 12 months    No pertinent past medical history     Patient Active Problem List   Diagnosis Date Noted   Pre-operative clearance 01/01/2023   Vasa previa in labor and delivery, antepartum 01/02/2016   Placental abruption in second trimester 11/29/2015   Placenta previa 11/27/2015   Routine general medical examination at a health care facility 03/13/2015   Encounter for routine gynecological examination 03/13/2015   Family history of colon cancer 03/13/2015   UTI (urinary tract infection) 04/01/2008   FIBROCYSTIC BREAST DISEASE 02/05/2007    Past Surgical History:  Procedure Laterality Date   CESAREAN SECTION N/A 01/23/2016   Procedure: CESAREAN SECTION;  Surgeon: Richarda Overlie, MD;  Location: [facility];  Service: Obstetrics;  Laterality: N/A;  primary edc 03/07/16    CRYOTHERAPY  2009   DILATION AND EVACUATION N/A 10/12/2014   Procedure: DILATATION AND EVACUATION;  Surgeon: Meriel Pica, MD;  Location: [facility];  Service: Gynecology;  Laterality: N/A;    OB History     Gravida  5   Para  3   Term  1   Preterm  2   AB  2   Living  2      SAB  1   IAB      Ectopic  1   Multiple  0   Live Births  2            Home Medications    Prior to Admission medications   Medication Sig Start Date End Date Taking? Authorizing Provider   ciprofloxacin (CIPRO) 500 MG tablet Take 1 tablet (500 mg total) by mouth 2 (two) times daily for 3 days. 01/21/24 01/24/24 Yes Wylie Russon, Elita Boone, NP    Family History Family History  Problem Relation Age of Onset   Colon cancer Father    Esophageal varices Father    Hypertension Father    Stroke Father    Atrial fibrillation Mother    Autism Son     Social History Social History   Tobacco Use   Smoking status: Former    Current packs/day: 0.50    Average packs/day: 0.5 packs/day for 3.0 years (1.5 ttl pk-yrs)    Types: Cigarettes   Smokeless tobacco: Never  Vaping Use   Vaping status: Never Used  Substance Use Topics   Alcohol use: No    Alcohol/week: 0.0 standard drinks of alcohol   Drug use: No     Allergies   Amoxapine and related, Amoxicillin-pot clavulanate, Aspirin, Penicillins, Pepcid [famotidine], Sulfonamide derivatives, and Terbutaline   Review of Systems Review of Systems   Physical Exam Triage Vital Signs ED Triage Vitals [01/21/24 1948]  Encounter Vitals Group     BP (!) 149/97     Systolic BP Percentile      Diastolic  BP Percentile      Pulse Rate (!) 107     Resp 18     Temp 98.1 F (36.7 C)     Temp Source Oral     SpO2 93 %     Weight      Height      Head Circumference      Peak Flow      Pain Score 2     Pain Loc      Pain Education      Exclude from Growth Chart    No data found.  Updated Vital Signs BP (!) 149/97 (BP Location: Left Arm)   Pulse (!) 107   Temp 98.1 F (36.7 C) (Oral)   Resp 18   SpO2 93%   Visual Acuity Right Eye Distance:   Left Eye Distance:   Bilateral Distance:    Right Eye Near:   Left Eye Near:    Bilateral Near:     Physical Exam Abdominal:     General: Abdomen is flat. Bowel sounds are normal.     Palpations: Abdomen is soft.     Tenderness: There is abdominal tenderness in the suprapubic area. There is no right CVA tenderness or left CVA tenderness.      UC Treatments / Results   Labs (all labs ordered are listed, but only abnormal results are displayed) Labs Reviewed  POCT URINALYSIS DIP (MANUAL ENTRY) - Abnormal; Notable for the following components:      Result Value   Spec Grav, UA <=1.005 (*)    Blood, UA trace-intact (*)    Nitrite, UA Positive (*)    Leukocytes, UA Large (3+) (*)    All other components within normal limits  URINE CULTURE    EKG   Radiology No results found.  Procedures Procedures (including critical care time)  Medications Ordered in UC Medications - No data to display  Initial Impression / Assessment and Plan / UC Course  I have reviewed the triage vital signs and the nursing notes.  Pertinent labs & imaging results that were available during my care of the patient were reviewed by me and considered in my medical decision making (see chart for details).  Acute cystitis without hematuria, urinary frequency  Urinalysis showing leukocytes and nitrates, sent for culture, chart review last urine culture showing Klebsiella pneumoniae, antibiotic chosen from this, prescribed ciprofloxacin which patient endorses discussed within the past, recommended supportive measures with follow-up as needed Final Clinical Impressions(s) / UC Diagnoses   Final diagnoses:  Urinary frequency  Acute cystitis without hematuria     Discharge Instructions      Your urinalysis shows Emillia Weatherly blood cells and nitrates which are indicative of infection, your urine will be sent to the lab to determine exactly which bacteria is present, if any changes need to be made to your medications you will be notified  Begin use of Ciprofloxacin twice daily for 3 days  You may use over-the-counter Azo to help minimize your symptoms until antibiotic removes bacteria, this medication will turn your urine orange  Increase your fluid intake through use of water  As always practice good hygiene, wiping front to back and avoidance of scented vaginal products to  prevent further irritation  If symptoms continue to persist after use of medication or recur please follow-up with urgent care or your primary doctor as needed    ED Prescriptions     Medication Sig Dispense Auth. Provider   ciprofloxacin (CIPRO) 500  MG tablet Take 1 tablet (500 mg total) by mouth 2 (two) times daily for 3 days. 6 tablet Valinda Hoar, NP      PDMP not reviewed this encounter.   Valinda Hoar, NP 01/21/24 1956

## 2024-01-21 NOTE — Discharge Instructions (Addendum)
 Your urinalysis shows Kaian Fahs blood cells and nitrates which are indicative of infection, your urine will be sent to the lab to determine exactly which bacteria is present, if any changes need to be made to your medications you will be notified  Begin use of Ciprofloxacin twice daily for 3 days  You may use over-the-counter Azo to help minimize your symptoms until antibiotic removes bacteria, this medication will turn your urine orange  Increase your fluid intake through use of water  As always practice good hygiene, wiping front to back and avoidance of scented vaginal products to prevent further irritation  If symptoms continue to persist after use of medication or recur please follow-up with urgent care or your primary doctor as needed

## 2024-01-23 ENCOUNTER — Telehealth (HOSPITAL_COMMUNITY): Payer: Self-pay

## 2024-01-23 LAB — URINE CULTURE: Culture: >=100000 — AB

## 2024-01-23 MED ORDER — NITROFURANTOIN MONOHYD MACRO 100 MG PO CAPS: 100.0000 mg | ORAL_CAPSULE | Freq: Two times a day (BID) | ORAL | 0 refills | Status: DC

## 2024-01-23 NOTE — Telephone Encounter (Signed)
 Per protocol, pt to dc Cipro and begin treatment with Macrobid.  Attempted to reach patient x1. LVM.  Rx sent to pharmacy on file.

## 2024-08-12 ENCOUNTER — Telehealth: Payer: Self-pay | Admitting: *Deleted

## 2024-08-12 NOTE — Telephone Encounter (Signed)
 Copied from CRM [id number]. 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 She would like to know if Dr Randeen could write ha letter for her stating that she is not being treated for the eczema and that it is a past issue?Patient would like a call to pick up letter when ready if provider is willing to write.

## 2024-08-12 NOTE — Telephone Encounter (Signed)
 I can't find anything about eczema in her chart when I search Before I do the letter (let her know Im out till Monday) , ask her about her eczema history, when she had it , who treated her , what she was treated with and the last time she experienced any eczema symptoms  Thanks

## 2024-08-13 NOTE — Telephone Encounter (Signed)
 Copied from CRM [id number]. Topic: Clinical - Medical Advice >> Aug 13, 2024  4:19 PM Shereese L wrote: Reason for CRM: Patient stated that she hasn't had any eczema episodes since before 2020 and haven't been on any creams or gels to treat it either. So patient is unaware of why insurance company is trying to deny her coverage. Patient is also aware of the Dr. Randeen being out til Monday.

## 2024-08-15 NOTE — Telephone Encounter (Signed)
 Note in my chart  Let me know if you need any changes

## 2024-08-17 NOTE — Telephone Encounter (Signed)
 Left VM letting pt know letter in mychart and if needs a paper copy then let us  know

## 2024-10-13 ENCOUNTER — Ambulatory Visit
Admission: EM | Admit: 2024-10-13 | Discharge: 2024-10-13 | Disposition: A | Discharge status: Home / Self Care | Attending: Emergency Medicine | Admitting: Emergency Medicine

## 2024-10-13 ENCOUNTER — Encounter: Payer: Self-pay | Admitting: Emergency Medicine

## 2024-10-13 DIAGNOSIS — R1024 Suprapubic pain: Secondary | ICD-10-CM | POA: Insufficient documentation

## 2024-10-13 DIAGNOSIS — N3000 Acute cystitis without hematuria: Secondary | ICD-10-CM | POA: Insufficient documentation

## 2024-10-13 LAB — POCT URINE DIPSTICK
Bilirubin, UA: NEGATIVE
Glucose, UA: NEGATIVE mg/dL
Ketones, POC UA: NEGATIVE mg/dL
Nitrite, UA: POSITIVE — AB
Spec Grav, UA: <=1.005 — AB
Urobilinogen, UA: 0.2 U/dL
pH, UA: 6

## 2024-10-13 MED ORDER — NITROFURANTOIN MONOHYD MACRO 100 MG PO CAPS: 100.0000 mg | ORAL_CAPSULE | Freq: Two times a day (BID) | ORAL | 0 refills | Status: AC

## 2024-10-13 NOTE — ED Triage Notes (Signed)
 Patient reports lower abdominal pain and left flank. Patient reports cloudy urine. Rates pain 2/10 and left flank pain 2/10. Patient has not taken anything for symptoms.

## 2024-10-13 NOTE — ED Provider Notes (Signed)
 " CAY RALPH PELT    CSN: [id number] Arrival date & time: 10/13/24  1149      History   Chief Complaint Chief Complaint  Patient presents with   Abdominal Pain   Flank Pain    HPI Natalie Kelley is a 43 y.o. female.   Patient presents for evaluation of left flank pain and abdominal pain beginning 1 day ago, experiencing dysuria today.  Has not attempted treatment.  Denies fever, hematuria, bowel vaginal symptoms.  History of reoccurring UTI.   Past Medical History:  Diagnosis Date   Abnormal pap 2009   Ectopic pregnancy    Miscarriage within last 12 months    No pertinent past medical history     Patient Active Problem List   Diagnosis Date Noted   Pre-operative clearance 01/01/2023   Vasa previa in labor and delivery, antepartum 01/02/2016   Placental abruption in second trimester 11/29/2015   Placenta previa 11/27/2015   Routine general medical examination at a health care facility 03/13/2015   Encounter for routine gynecological examination 03/13/2015   Family history of colon cancer 03/13/2015   UTI (urinary tract infection) 04/01/2008   FIBROCYSTIC BREAST DISEASE 02/05/2007    Past Surgical History:  Procedure Laterality Date   CESAREAN SECTION N/A 01/23/2016   Procedure: CESAREAN SECTION;  Surgeon: Charlie Croak, MD;  Location: [facility];  Service: Obstetrics;  Laterality: N/A;  primary edc 03/07/16    CRYOTHERAPY  2009   DILATION AND EVACUATION N/A 10/12/2014   Procedure: DILATATION AND EVACUATION;  Surgeon: Charlie CHRISTELLA Croak, MD;  Location: [facility];  Service: Gynecology;  Laterality: N/A;    OB History     Gravida  5   Para  3   Term  1   Preterm  2   AB  2   Living  2      SAB  1   IAB      Ectopic  1   Multiple  0   Live Births  2            Home Medications    Prior to Admission medications  Medication Sig Start Date End Date Taking? Authorizing Provider  nitrofurantoin , macrocrystal-monohydrate, (MACROBID ) 100 MG capsule  Take 1 capsule (100 mg total) by mouth 2 (two) times daily. 01/23/24   Vonna Sharlet POUR, MD    Family History Family History  Problem Relation Age of Onset   Colon cancer Father    Esophageal varices Father    Hypertension Father    Stroke Father    Atrial fibrillation Mother    Autism Son     Social History Social History[1]   Allergies   Amoxapine and related, Amoxicillin -pot clavulanate, Aspirin, Penicillins, Pepcid [famotidine], Sulfonamide derivatives, and Terbutaline   Review of Systems Review of Systems  Gastrointestinal:  Positive for abdominal pain.  Genitourinary:  Positive for flank pain.     Physical Exam Triage Vital Signs ED Triage Vitals  Encounter Vitals Group     BP 10/13/24 1333 114/80     Girls Systolic BP Percentile --      Girls Diastolic BP Percentile --      Boys Systolic BP Percentile --      Boys Diastolic BP Percentile --      Pulse Rate 10/13/24 1333 99     Resp 10/13/24 1333 17     Temp 10/13/24 1333 98.5 F (36.9 C)     Temp Source 10/13/24 1333 Oral  SpO2 10/13/24 1333 98 %     Weight --      Height --      Head Circumference --      Peak Flow --      Pain Score 10/13/24 1335 2     Pain Loc --      Pain Education --      Exclude from Growth Chart --    No data found.  Updated Vital Signs BP 114/80 (BP Location: Right Arm)   Pulse 99   Temp 98.5 F (36.9 C) (Oral)   Resp 17   LMP 10/04/2024 (Exact Date)   SpO2 98%   Visual Acuity Right Eye Distance:   Left Eye Distance:   Bilateral Distance:    Right Eye Near:   Left Eye Near:    Bilateral Near:     Physical Exam Constitutional:      Appearance: Normal appearance.  Eyes:     Extraocular Movements: Extraocular movements intact.  Pulmonary:     Effort: Pulmonary effort is normal.  Abdominal:     Tenderness: There is abdominal tenderness in the suprapubic area. There is left CVA tenderness. There is no right CVA tenderness.  Neurological:     Mental  Status: She is alert and oriented to person, place, and time.      UC Treatments / Results  Labs (all labs ordered are listed, but only abnormal results are displayed) Labs Reviewed  URINE CULTURE  POCT URINE DIPSTICK    EKG   Radiology No results found.  Procedures Procedures (including critical care time)  Medications Ordered in UC Medications - No data to display  Initial Impression / Assessment and Plan / UC Course  I have reviewed the triage vital signs and the nursing notes.  Pertinent labs & imaging results that were available during my care of the patient were reviewed by me and considered in my medical decision making (see chart for details).  Acute cystitis without hematuria, acute suprapubic pain  Urinalysis showing leukocytes and nitrates, sent for culture, prior urine culture showing E. coli and Klebsiella with multiple resistance to antibiotics, initiating Macrobid , discussed with patient, recommend over-the-counter medications and nonpharmacological supportive care with follow-up as needed Final Clinical Impressions(s) / UC Diagnoses   Final diagnoses:  Acute suprapubic pain   Discharge Instructions   None    ED Prescriptions   None    PDMP not reviewed this encounter.     [1]  Social History Tobacco Use   Smoking status: Former    Current packs/day: 0.50    Average packs/day: 0.5 packs/day for 3.0 years (1.5 ttl pk-yrs)    Types: Cigarettes   Smokeless tobacco: Never  Vaping Use   Vaping status: Never Used  Substance Use Topics   Alcohol use: No    Alcohol/week: 0.0 standard drinks of alcohol   Drug use: No     Teresa Shelba SAUNDERS, NP 10/13/24 1348  "

## 2024-10-13 NOTE — Discharge Instructions (Signed)
Your urinalysis shows Natalie Kelley blood cells and nitrates which are indicative of infection, your urine will be sent to the lab to determine exactly which bacteria is present, if any changes need to be made to your medications you will be notified  Begin use of Macrobid twice daily for 5 days   You may use over-the-counter Azo to help minimize your symptoms until antibiotic removes bacteria, this medication will turn your urine orange  Increase your fluid intake through use of water  As always practice good hygiene, wiping front to back and avoidance of scented vaginal products to prevent further irritation  If symptoms continue to persist after use of medication or recur please follow-up with urgent care or your primary doctor as needed  

## 2024-10-15 ENCOUNTER — Ambulatory Visit (HOSPITAL_COMMUNITY): Payer: Self-pay

## 2024-10-15 LAB — URINE CULTURE: Culture: 30000 — AB
